# Patient Record
Sex: Female | Born: 1945 | Race: White | Hispanic: No | Marital: Single | Smoking: Never smoker
Health system: Southern US, Community
[De-identification: ages and names within clinical notes are randomized; demographics above are authoritative.]

## PROBLEM LIST (undated history)

## (undated) DIAGNOSIS — J45909 Unspecified asthma, uncomplicated: Secondary | ICD-10-CM

## (undated) DIAGNOSIS — I10 Essential (primary) hypertension: Secondary | ICD-10-CM

## (undated) HISTORY — PX: TONSILLECTOMY: SUR1361

## (undated) HISTORY — PX: ABDOMINAL HYSTERECTOMY: SHX81

## (undated) HISTORY — PX: CHOLECYSTECTOMY: SHX55

---

## 2017-01-20 ENCOUNTER — Emergency Department (HOSPITAL_BASED_OUTPATIENT_CLINIC_OR_DEPARTMENT_OTHER): Payer: Medicare Other

## 2017-01-20 ENCOUNTER — Emergency Department (HOSPITAL_BASED_OUTPATIENT_CLINIC_OR_DEPARTMENT_OTHER)
Admission: EM | Admit: 2017-01-20 | Discharge: 2017-01-20 | Disposition: A | Discharge status: Home / Self Care | Payer: Medicare Other | Attending: Emergency Medicine | Admitting: Emergency Medicine

## 2017-01-20 ENCOUNTER — Encounter (HOSPITAL_BASED_OUTPATIENT_CLINIC_OR_DEPARTMENT_OTHER): Payer: Self-pay | Admitting: *Deleted

## 2017-01-20 DIAGNOSIS — J45909 Unspecified asthma, uncomplicated: Secondary | ICD-10-CM | POA: Insufficient documentation

## 2017-01-20 DIAGNOSIS — J441 Chronic obstructive pulmonary disease with (acute) exacerbation: Secondary | ICD-10-CM | POA: Diagnosis not present

## 2017-01-20 DIAGNOSIS — Z79899 Other long term (current) drug therapy: Secondary | ICD-10-CM | POA: Diagnosis not present

## 2017-01-20 DIAGNOSIS — R0602 Shortness of breath: Secondary | ICD-10-CM | POA: Diagnosis present

## 2017-01-20 DIAGNOSIS — I10 Essential (primary) hypertension: Secondary | ICD-10-CM | POA: Diagnosis not present

## 2017-01-20 HISTORY — DX: Unspecified asthma, uncomplicated: J45.909

## 2017-01-20 HISTORY — DX: Essential (primary) hypertension: I10

## 2017-01-20 LAB — BRAIN NATRIURETIC PEPTIDE: B NATRIURETIC PEPTIDE 5: 108.8 pg/mL — AB (ref 0.0–100.0)

## 2017-01-20 LAB — TROPONIN I: Troponin I: <0.03 ng/mL (ref <0.03)

## 2017-01-20 LAB — COMPREHENSIVE METABOLIC PANEL
ALBUMIN: 3.4 g/dL — AB (ref 3.5–5.0)
ALT: 27 U/L (ref 14–54)
AST: 26 U/L (ref 15–41)
Alkaline Phosphatase: 60 U/L (ref 38–126)
Anion gap: 11 (ref 5–15)
BILIRUBIN TOTAL: 0.7 mg/dL (ref 0.3–1.2)
BUN: 21 mg/dL — AB (ref 6–20)
CHLORIDE: 102 mmol/L (ref 101–111)
CO2: 28 mmol/L (ref 22–32)
CREATININE: 1.04 mg/dL — AB (ref 0.44–1.00)
Calcium: 9 mg/dL (ref 8.9–10.3)
GFR calc Af Amer: >60 mL/min (ref >60)
GFR, EST NON AFRICAN AMERICAN: 52 mL/min — AB (ref >60)
GLUCOSE: 91 mg/dL (ref 65–99)
POTASSIUM: 3.4 mmol/L — AB (ref 3.5–5.1)
Sodium: 141 mmol/L (ref 135–145)
Total Protein: 7 g/dL (ref 6.5–8.1)

## 2017-01-20 LAB — CBC WITH DIFFERENTIAL/PLATELET
BASOS ABS: 0 10*3/uL (ref 0.0–0.1)
BASOS PCT: 0 %
Eosinophils Absolute: 0.1 10*3/uL (ref 0.0–0.7)
Eosinophils Relative: 1 %
HEMATOCRIT: 45.5 % (ref 36.0–46.0)
Hemoglobin: 15.3 g/dL — ABNORMAL HIGH (ref 12.0–15.0)
Lymphocytes Relative: 27 %
Lymphs Abs: 3.1 10*3/uL (ref 0.7–4.0)
MCH: 32.3 pg (ref 26.0–34.0)
MCHC: 33.6 g/dL (ref 30.0–36.0)
MCV: 96 fL (ref 78.0–100.0)
Monocytes Absolute: 1 10*3/uL (ref 0.1–1.0)
Monocytes Relative: 9 %
Neutro Abs: 7.4 10*3/uL (ref 1.7–7.7)
Neutrophils Relative %: 63 %
PLATELETS: 273 10*3/uL (ref 150–400)
RBC: 4.74 MIL/uL (ref 3.87–5.11)
RDW: 13.3 % (ref 11.5–15.5)
WBC: 11.6 10*3/uL — AB (ref 4.0–10.5)

## 2017-01-20 MED ORDER — METHYLPREDNISOLONE SODIUM SUCC 125 MG IJ SOLR
125.0000 mg | Freq: Once | INTRAMUSCULAR | Status: AC
  Administered 2017-01-20: 125 mg via INTRAVENOUS
  Filled 2017-01-20: qty 2

## 2017-01-20 MED ORDER — PREDNISONE 20 MG PO TABS: ORAL_TABLET | ORAL | 0 refills | Status: DC

## 2017-01-20 MED ORDER — ALBUTEROL (5 MG/ML) CONTINUOUS INHALATION SOLN
15.0000 mg/h | INHALATION_SOLUTION | Freq: Once | RESPIRATORY_TRACT | Status: AC
  Administered 2017-01-20: 15 mg/h via RESPIRATORY_TRACT
  Filled 2017-01-20: qty 20

## 2017-01-20 MED ORDER — IPRATROPIUM-ALBUTEROL 0.5-2.5 (3) MG/3ML IN SOLN
3.0000 mL | Freq: Once | RESPIRATORY_TRACT | Status: AC
  Administered 2017-01-20: 3 mL via RESPIRATORY_TRACT

## 2017-01-20 MED ORDER — IPRATROPIUM BROMIDE 0.02 % IN SOLN
1.0000 mg | Freq: Once | RESPIRATORY_TRACT | Status: AC
  Administered 2017-01-20: 1 mg via RESPIRATORY_TRACT
  Filled 2017-01-20: qty 5

## 2017-01-20 MED ORDER — IPRATROPIUM-ALBUTEROL 0.5-2.5 (3) MG/3ML IN SOLN
RESPIRATORY_TRACT | Status: AC
  Administered 2017-01-20: 3 mL via RESPIRATORY_TRACT
  Filled 2017-01-20: qty 3

## 2017-01-20 NOTE — ED Notes (Signed)
Pt ambulated while monitoring SpO2. SpO2 started at 96% and dropped to 93% at it's lowest during ambulation. HR started at 106 and raised to 122.

## 2017-01-20 NOTE — Discharge Instructions (Signed)
Take prednisone as prescribed.  Continue albuterol as needed for wheezing and cough.   See your doctor this week.   Return to ER if you have worse trouble breathing, wheezing, fever.

## 2017-01-20 NOTE — ED Provider Notes (Signed)
[facility] Note   CSN: [id number] Arrival date & time: 01/20/17  1258  By signing my name below, I, Clovis PuAvnee Patel, attest that this documentation has been prepared under the direction and in the presence of Charlynne Panderavid Hsienta Yao, MD  Electronically Signed: Clovis PuAvnee Patel, ED Scribe. 01/20/17. 5:00 PM.  History   Chief Complaint Chief Complaint  Patient presents with  . Shortness of Breath   The history is provided by the patient. No language interpreter was used.   HPI Comments:  Marissa Lamb is a 71 y.o. female, with a hx of asthma and HTN, who presents to the Emergency Department complaining of persistent, intermittent SOB onset 10 days. She also reports increased leg swelling and a cough. Pt was seen at Urgent Care 5 days ago, was negative for flu, given an solumedrol injection, prescribed 60 mg prednisone and a Z-pack which has provided no relief. She also visited Dr. Ernestina PennaWiggins today for follow up. She has been using her inhaler which provides temporary relief. Pt denies fever, hx of heart problems, any recent sick contacts diagnosed with the flu and any other associated symptoms. Pt notes her last ED visit for COPD was in 09/2016, no recent admission for COPD.   Past Medical History:  Diagnosis Date  . Asthma   . Hypertension     There are no active problems to display for this patient.   History reviewed. No pertinent surgical history.  OB History    No data available       Home Medications    Prior to Admission medications   Medication Sig Start Date End Date Taking? Authorizing Provider  albuterol (PROVENTIL HFA;VENTOLIN HFA) 108 (90 Base) MCG/ACT inhaler Inhale into the lungs every 6 (six) hours as needed for wheezing or shortness of breath.   Yes Historical Provider, MD  azithromycin (ZITHROMAX) 500 MG tablet Take by mouth daily.   Yes Historical Provider, MD  benzonatate (TESSALON) 100 MG capsule Take by mouth 3 (three) times daily as needed for  cough.   Yes Historical Provider, MD  esomeprazole (NEXIUM) 40 MG capsule Take 40 mg by mouth daily at 12 noon.   Yes Historical Provider, MD  etodolac (LODINE) 400 MG tablet Take 400 mg by mouth 2 (two) times daily.   Yes Historical Provider, MD  fluticasone (FLOVENT DISKUS) 50 MCG/BLIST diskus inhaler Inhale 1 puff into the lungs 2 (two) times daily.   Yes Historical Provider, MD  gabapentin (NEURONTIN) 300 MG capsule Take 300 mg by mouth 3 (three) times daily.   Yes Historical Provider, MD  ipratropium-albuterol (DUONEB) 0.5-2.5 (3) MG/3ML SOLN Take 3 mLs by nebulization.   Yes Historical Provider, MD  metoprolol succinate (TOPROL-XL) 100 MG 24 hr tablet Take 100 mg by mouth daily. Take with or immediately following a meal.   Yes Historical Provider, MD  predniSONE (DELTASONE) 20 MG tablet Take 60 mg by mouth daily with breakfast.   Yes Historical Provider, MD    Family History History reviewed. No pertinent family history.  Social History Social History  Substance Use Topics  . Smoking status: Never Smoker  . Smokeless tobacco: Never Used  . Alcohol use Not on file     Allergies   Patient has no known allergies.   Review of Systems Review of Systems  Respiratory: Positive for cough, shortness of breath and wheezing.   Cardiovascular: Positive for leg swelling.  All other systems reviewed and are negative.   Physical Exam Updated Vital Signs BP  159/88   Pulse 82   Temp 98.5 F (36.9 C) (Oral)   Resp 20   SpO2 97%   Physical Exam  Constitutional: She is oriented to person, place, and time. She appears well-developed and well-nourished. No distress.  HENT:  Head: Normocephalic and atraumatic.  Eyes: EOM are normal.  Neck: Normal range of motion.  Cardiovascular: Normal rate, regular rhythm and normal heart sounds.   Pulmonary/Chest: Effort normal. She has wheezes.  Diffuse wheezing, more on the left side.   Abdominal: Soft. She exhibits no distension. There is no  tenderness.  Musculoskeletal: Normal range of motion. She exhibits edema.  1+ edema bilaterally  Neurological: She is alert and oriented to person, place, and time.  Skin: Skin is warm and dry.  Psychiatric: She has a normal mood and affect. Judgment normal.  Nursing note and vitals reviewed.    ED Treatments / Results  DIAGNOSTIC STUDIES:  Oxygen Saturation is 100% on RA, normal by my interpretation.    COORDINATION OF CARE:  5:00 PM Discussed treatment plan with pt at bedside and pt agreed to plan.  Labs (all labs ordered are listed, but only abnormal results are displayed) Labs Reviewed  CBC WITH DIFFERENTIAL/PLATELET - Abnormal; Notable for the following:       Result Value   WBC 11.6 (*)    Hemoglobin 15.3 (*)    All other components within normal limits  COMPREHENSIVE METABOLIC PANEL - Abnormal; Notable for the following:    Potassium 3.4 (*)    BUN 21 (*)    Creatinine, Ser 1.04 (*)    Albumin 3.4 (*)    GFR calc non Af Amer 52 (*)    All other components within normal limits  BRAIN NATRIURETIC PEPTIDE - Abnormal; Notable for the following:    B Natriuretic Peptide 108.8 (*)    All other components within normal limits  TROPONIN I    EKG  EKG Interpretation  Date/Time:  Monday January 20 2017 13:12:57 EST Ventricular Rate:  74 PR Interval:  148 QRS Duration: 88 QT Interval:  382 QTC Calculation: 424 R Axis:   52 Text Interpretation:  Sinus rhythm with Premature supraventricular complexes Otherwise normal ECG No previous ECGs available Confirmed by YAO  MD, DAVID ([id number]) on 01/20/2017 4:24:32 PM       Radiology Dg Chest 2 View  Result Date: 01/20/2017 CLINICAL DATA:  Ten day history of cough, shortness of breath and chest congestion. EXAM: CHEST  2 VIEW COMPARISON:  10/14/2016, 07/18/2014, including CTA chest 10/14/2016. FINDINGS: Cardiac silhouette mildly enlarged, unchanged. Hilar and mediastinal contours otherwise unremarkable. Lungs clear.  Bronchovascular markings normal. Pulmonary vascularity normal. No visible pleural effusions. No pneumothorax. Degenerative changes throughout the thoracic spine. IMPRESSION: Stable mild cardiomegaly.  No acute cardiopulmonary disease. Electronically Signed   By: Hulan Saas M.D.   On: 01/20/2017 16:45    Procedures Procedures (including critical care time)  Medications Ordered in ED Medications  ipratropium-albuterol (DUONEB) 0.5-2.5 (3) MG/3ML nebulizer solution 3 mL (3 mLs Nebulization Given 01/20/17 1316)  albuterol (PROVENTIL,VENTOLIN) solution continuous neb (15 mg/hr Nebulization Given 01/20/17 1715)  ipratropium (ATROVENT) nebulizer solution 1 mg (1 mg Nebulization Given 01/20/17 1715)  methylPREDNISolone sodium succinate (SOLU-MEDROL) 125 mg/2 mL injection 125 mg (125 mg Intravenous Given 01/20/17 1720)     Initial Impression / Assessment and Plan / ED Course  I have reviewed the triage vital signs and the nursing notes.  Pertinent labs & imaging results that were available during my  care of the patient were reviewed by me and considered in my medical decision making (see chart for details).    Marissa Lamb is a 71 y.o. female here with cough, SOB. Wheezing throughout, slightly tachypneic. Mild pedal edema. Consider bronchitis vs COPD vs flu vs new onset CHF. I doubt ACS and symptoms for several days so trop x 1 sufficient. Will get labs, BNP, CXR. Will give continuous neb, solumedrol and reassess.   7:25 PM Felt better after continuous neb, solumedrol. Ambulated and O2 was 92% on RA. Improved air movement now. Minimal wheezing now. CXR showed no pneumonia. Labs unremarkable. Will dc home with steroid taper. Has albuterol at home.    Final Clinical Impressions(s) / ED Diagnoses   Final diagnoses:  None    New Prescriptions New Prescriptions   No medications on file  I personally performed the services described in this documentation, which was scribed in my presence. The  recorded information has been reviewed and is accurate.    Charlynne Pander, MD 01/20/17 [phone]

## 2017-01-20 NOTE — ED Notes (Signed)
Patient transported to X-ray 

## 2017-01-20 NOTE — ED Triage Notes (Signed)
Pt reports 10 days of chest congestion and cough, seen at urgent care with neg flu test, given solumedrol inj and prednisone rx, pt states that the prednisone isn't helping as much as it usually does, and she still feels bad.

## 2017-05-18 ENCOUNTER — Inpatient Hospital Stay (HOSPITAL_BASED_OUTPATIENT_CLINIC_OR_DEPARTMENT_OTHER)
Admission: EM | Admit: 2017-05-18 | Discharge: 2017-05-21 | DRG: 603 | Disposition: A | Discharge status: Home / Self Care | Payer: Medicare Other | Attending: Internal Medicine | Admitting: Internal Medicine

## 2017-05-18 ENCOUNTER — Emergency Department (HOSPITAL_BASED_OUTPATIENT_CLINIC_OR_DEPARTMENT_OTHER): Payer: Medicare Other

## 2017-05-18 ENCOUNTER — Encounter (HOSPITAL_BASED_OUTPATIENT_CLINIC_OR_DEPARTMENT_OTHER): Payer: Self-pay

## 2017-05-18 DIAGNOSIS — Z9049 Acquired absence of other specified parts of digestive tract: Secondary | ICD-10-CM | POA: Diagnosis not present

## 2017-05-18 DIAGNOSIS — L0390 Cellulitis, unspecified: Secondary | ICD-10-CM | POA: Diagnosis present

## 2017-05-18 DIAGNOSIS — Z90710 Acquired absence of both cervix and uterus: Secondary | ICD-10-CM | POA: Diagnosis not present

## 2017-05-18 DIAGNOSIS — M79605 Pain in left leg: Secondary | ICD-10-CM

## 2017-05-18 DIAGNOSIS — F39 Unspecified mood [affective] disorder: Secondary | ICD-10-CM | POA: Diagnosis present

## 2017-05-18 DIAGNOSIS — J45909 Unspecified asthma, uncomplicated: Secondary | ICD-10-CM | POA: Diagnosis present

## 2017-05-18 DIAGNOSIS — Z6841 Body Mass Index (BMI) 40.0 and over, adult: Secondary | ICD-10-CM

## 2017-05-18 DIAGNOSIS — I10 Essential (primary) hypertension: Secondary | ICD-10-CM | POA: Diagnosis present

## 2017-05-18 DIAGNOSIS — G4733 Obstructive sleep apnea (adult) (pediatric): Secondary | ICD-10-CM | POA: Diagnosis present

## 2017-05-18 DIAGNOSIS — Z9181 History of falling: Secondary | ICD-10-CM | POA: Diagnosis not present

## 2017-05-18 DIAGNOSIS — L03116 Cellulitis of left lower limb: Secondary | ICD-10-CM | POA: Diagnosis not present

## 2017-05-18 DIAGNOSIS — Z79899 Other long term (current) drug therapy: Secondary | ICD-10-CM

## 2017-05-18 DIAGNOSIS — Z9119 Patient's noncompliance with other medical treatment and regimen: Secondary | ICD-10-CM

## 2017-05-18 DIAGNOSIS — Z7952 Long term (current) use of systemic steroids: Secondary | ICD-10-CM | POA: Diagnosis not present

## 2017-05-18 DIAGNOSIS — S81812A Laceration without foreign body, left lower leg, initial encounter: Secondary | ICD-10-CM | POA: Diagnosis present

## 2017-05-18 DIAGNOSIS — L539 Erythematous condition, unspecified: Secondary | ICD-10-CM

## 2017-05-18 HISTORY — DX: Morbid (severe) obesity due to excess calories: E66.01

## 2017-05-18 LAB — COMPREHENSIVE METABOLIC PANEL
ALT: 31 U/L (ref 14–54)
ANION GAP: 8 (ref 5–15)
AST: 29 U/L (ref 15–41)
Albumin: 3.4 g/dL — ABNORMAL LOW (ref 3.5–5.0)
Alkaline Phosphatase: 76 U/L (ref 38–126)
BUN: 18 mg/dL (ref 6–20)
CO2: 29 mmol/L (ref 22–32)
Calcium: 8.6 mg/dL — ABNORMAL LOW (ref 8.9–10.3)
Chloride: 101 mmol/L (ref 101–111)
Creatinine, Ser: 0.74 mg/dL (ref 0.44–1.00)
GFR calc Af Amer: >60 mL/min (ref >60)
Glucose, Bld: 123 mg/dL — ABNORMAL HIGH (ref 65–99)
POTASSIUM: 3.8 mmol/L (ref 3.5–5.1)
Sodium: 138 mmol/L (ref 135–145)
TOTAL PROTEIN: 6.8 g/dL (ref 6.5–8.1)
Total Bilirubin: 0.6 mg/dL (ref 0.3–1.2)

## 2017-05-18 LAB — CBC WITH DIFFERENTIAL/PLATELET
Basophils Absolute: 0 10*3/uL (ref 0.0–0.1)
Basophils Relative: 0 %
Eosinophils Absolute: 0.3 10*3/uL (ref 0.0–0.7)
Eosinophils Relative: 3 %
HEMATOCRIT: 42.2 % (ref 36.0–46.0)
Hemoglobin: 13.9 g/dL (ref 12.0–15.0)
LYMPHS PCT: 25 %
Lymphs Abs: 2.5 10*3/uL (ref 0.7–4.0)
MCH: 32.4 pg (ref 26.0–34.0)
MCHC: 32.9 g/dL (ref 30.0–36.0)
MCV: 98.4 fL (ref 78.0–100.0)
MONO ABS: 0.9 10*3/uL (ref 0.1–1.0)
MONOS PCT: 9 %
NEUTROS ABS: 6.3 10*3/uL (ref 1.7–7.7)
Neutrophils Relative %: 63 %
Platelets: 252 10*3/uL (ref 150–400)
RBC: 4.29 MIL/uL (ref 3.87–5.11)
RDW: 13 % (ref 11.5–15.5)
WBC: 10 10*3/uL (ref 4.0–10.5)

## 2017-05-18 LAB — I-STAT CG4 LACTIC ACID, ED: LACTIC ACID, VENOUS: 1.16 mmol/L (ref 0.5–1.9)

## 2017-05-18 MED ORDER — CLINDAMYCIN PHOSPHATE 600 MG/50ML IV SOLN
600.0000 mg | Freq: Once | INTRAVENOUS | Status: AC
  Administered 2017-05-18: 600 mg via INTRAVENOUS
  Filled 2017-05-18: qty 50

## 2017-05-18 MED ORDER — VANCOMYCIN HCL 10 G IV SOLR
1750.0000 mg | INTRAVENOUS | Status: DC
  Filled 2017-05-18: qty 1750

## 2017-05-18 MED ORDER — VANCOMYCIN HCL 10 G IV SOLR
2000.0000 mg | Freq: Once | INTRAVENOUS | Status: DC
  Filled 2017-05-18: qty 2000

## 2017-05-18 MED ORDER — IOPAMIDOL (ISOVUE-300) INJECTION 61%
100.0000 mL | Freq: Once | INTRAVENOUS | Status: AC | PRN
  Administered 2017-05-19: 100 mL via INTRAVENOUS

## 2017-05-18 MED ORDER — VANCOMYCIN HCL 10 G IV SOLR
2500.0000 mg | Freq: Once | INTRAVENOUS | Status: DC
  Filled 2017-05-18: qty 2500

## 2017-05-18 NOTE — ED Provider Notes (Signed)
[facility]   CSN: [id number] Arrival date & time: 05/18/17  1449 By signing my name below, I, Marissa Lamb, attest that this documentation has been prepared under the direction and in the presence of Marissa Lamb, Marissa Brimhristopher J, MD . Electronically Signed: Levon HedgerElizabeth Lamb, Scribe. 05/18/2017. 7:28 PM.   History   Chief Complaint Chief Complaint  Patient presents with  . Wound Check    HPI Marissa Lamb is a 71 y.o. female with a history of asthma and HTN who presents to the Emergency Department complaining of progressively worsening erythema and swelling to LLE laceration. The laceration was sutured and she was started on 7 day course of Keflex 500 mg which she has taken with no relief. Two days ago, pt noticed a small pustule to the wound. Pt reports associated blistering and intermittent pain to her LLE. Pain is exacerbated by ambulation. No hx of prior skin infections or DM. She denies any fever, chills, nausea, vomiting, left knee or hip pain, or numbness.  The history is provided by the patient and a relative. No language interpreter was used.  Rash   This is a new problem. The current episode started more than 2 days ago. The problem has been rapidly worsening. Associated with: laceration. There has been no fever. The rash is present on the left lower leg. The pain is moderate. The pain has been constant since onset. Associated symptoms include blisters and pain. Treatments tried: antibiotics. The treatment provided no relief.   Past Medical History:  Diagnosis Date  . Asthma   . Hypertension   . Morbid obesity (HCC)     There are no active problems to display for this patient.   Past Surgical History:  Procedure Laterality Date  . ABDOMINAL HYSTERECTOMY    . CHOLECYSTECTOMY    . TONSILLECTOMY      OB History    No data available     Home Medications    Prior to Admission medications   Medication Sig Start Date End Date Taking? Authorizing  Provider  albuterol (PROVENTIL HFA;VENTOLIN HFA) 108 (90 Base) MCG/ACT inhaler Inhale into the lungs every 6 (six) hours as needed for wheezing or shortness of breath.   Yes [provider]  esomeprazole (NEXIUM) 40 MG capsule Take 40 mg by mouth daily at 12 noon.   Yes [provider]  etodolac (LODINE) 400 MG tablet Take 400 mg by mouth 2 (two) times daily.   Yes [provider]  fluticasone (FLONASE) 50 MCG/ACT nasal spray Place into both nostrils daily.   Yes [provider]  fluticasone (FLOVENT DISKUS) 50 MCG/BLIST diskus inhaler Inhale 1 puff into the lungs 2 (two) times daily.   Yes [provider]  gabapentin (NEURONTIN) 300 MG capsule Take 300 mg by mouth 3 (three) times daily.   Yes [provider]  ipratropium-albuterol (DUONEB) 0.5-2.5 (3) MG/3ML SOLN Take 3 mLs by nebulization.   Yes [provider]  metoprolol succinate (TOPROL-XL) 100 MG 24 hr tablet Take 100 mg by mouth daily. Take with or immediately following a meal.   Yes [provider]  azithromycin (ZITHROMAX) 500 MG tablet Take by mouth daily.    [provider]  benzonatate (TESSALON) 100 MG capsule Take by mouth 3 (three) times daily as needed for cough.    [provider]  predniSONE (DELTASONE) 20 MG tablet Take 60 mg daily x 3 days then 40 mg daily x 3 days then 20 mg daily x 3  days 01/20/17   Charlynne Pander, MD    Family History History reviewed. No pertinent family history.  Social History Social History  Substance Use Topics  . Smoking status: Never Smoker  . Smokeless tobacco: Never Used  . Alcohol use No     Allergies   Patient has no known allergies.   Review of Systems Review of Systems  Constitutional: Negative for appetite change, chills, diaphoresis, fatigue and fever.  HENT: Negative for congestion and rhinorrhea.   Eyes: Negative for visual disturbance.  Respiratory: Negative for apnea, chest  tightness, shortness of breath, wheezing and stridor.   Cardiovascular: Negative for chest pain.  Gastrointestinal: Negative for abdominal pain, constipation, diarrhea, nausea and vomiting.  Genitourinary: Negative for dysuria, flank pain and frequency.  Musculoskeletal: Positive for myalgias. Negative for arthralgias, back pain, neck pain and neck stiffness.  Skin: Positive for color change, rash and wound.  Neurological: Negative for light-headedness, numbness and headaches.  Psychiatric/Behavioral: Negative for agitation and confusion.  All other systems reviewed and are negative.  Physical Exam Updated Vital Signs BP (!) 144/75 (BP Location: Left Arm)   Pulse 79   Temp 99.2 F (37.3 C) (Oral)   Resp (!) 22   Ht 5' 2.5" (1.588 m)   Wt (!) 330 lb (149.7 kg)   SpO2 97%   BMI 59.40 kg/m   Physical Exam  Constitutional: She is oriented to person, place, and time. She appears well-developed and well-nourished. No distress.  HENT:  Head: Normocephalic and atraumatic.  Mouth/Throat: Oropharynx is clear and moist. No oropharyngeal exudate.  Eyes: EOM are normal. Pupils are equal, round, and reactive to light.  Neck: Normal range of motion.  Cardiovascular: Normal rate, regular rhythm, normal heart sounds, intact distal pulses and normal pulses.   Pulmonary/Chest: Effort normal and breath sounds normal. She has no wheezes. She exhibits no tenderness.  Abdominal: Soft. She exhibits no distension. There is no tenderness.  Musculoskeletal: Normal range of motion. She exhibits edema and tenderness.       Left lower leg: She exhibits tenderness, swelling, edema and laceration.  Neurological: She is alert and oriented to person, place, and time. No sensory deficit. She exhibits normal muscle tone.  Skin: Skin is warm and dry. Capillary refill takes less than 2 seconds. Rash noted. She is not diaphoretic. There is erythema.  Left Lower extremity: Large area of induration and tenderness with  vesicles surrounding the laceration repair site. Good pulses. Normal sensation, normal strength. No crepitance. Photo attached below.   Psychiatric: She has a normal mood and affect. Judgment normal.  Nursing note and vitals reviewed.    ED Treatments / Results  DIAGNOSTIC STUDIES:  Oxygen Saturation is 97% on RA, normal by my interpretation.    COORDINATION OF CARE:  7:27 PM Discussed treatment plan with pt at bedside and pt agreed to plan.   Labs (all labs ordered are listed, but only abnormal results are displayed) Labs Reviewed  COMPREHENSIVE METABOLIC PANEL - Abnormal; Notable for the following:       Result Value   Glucose, Bld 123 (*)    Calcium 8.6 (*)    Albumin 3.4 (*)    All other components within normal limits  CULTURE, BLOOD (ROUTINE X 2)  CULTURE, BLOOD (ROUTINE X 2)  CBC WITH DIFFERENTIAL/PLATELET  CREATININE, SERUM  I-STAT CG4 LACTIC ACID, ED  I-STAT CG4 LACTIC ACID, ED    EKG  EKG Interpretation None       Radiology Dg Tibia/fibula  Left  Result Date: 05/18/2017 CLINICAL DATA:  Progressive pain and swelling. Laceration, sutured 1 week prior. EXAM: LEFT TIBIA AND FIBULA - 2 VIEW COMPARISON:  None. FINDINGS: No fracture. 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 No periosteal reaction or bony destructive change. Soft tissue edema is most prominent anterior laterally. Probable skin defect about the mid distal leg. No radiopaque foreign body or tracking soft tissue air. IMPRESSION: Soft tissue edema is most prominent anterior laterally, possible skin defect about the mid distal lateral lower leg. No acute osseous abnormalities. Electronically Signed   By: Rubye Oaks M.D.   On: 05/18/2017 23:46    Procedures Procedures (including critical care time)  EMERGENCY DEPARTMENT  US GUIDANCE EXAM Emergency Ultrasound:  US Guidance for Needle Guidance  INDICATIONS: Difficult vascular access Linear probe  used in real-time to visualize location of needle entry through skin.   PERFORMED BY: Myself IMAGES ARCHIVED?: Yes LIMITATIONS: obesity VIEWS USED: Transverse INTERPRETATION: Needle visualized within vein, Left arm and Needle gauge 20    Medications Ordered in ED Medications  iopamidol (ISOVUE-300) 61 % injection 100 mL (not administered)  clindamycin (CLEOCIN) IVPB 600 mg (0 mg Intravenous Stopped 05/18/17 2129)     Initial Impression / Assessment and Plan / ED Course  I have reviewed the triage vital signs and the nursing notes.  Pertinent labs & imaging results that were available during my care of the patient were reviewed by me and considered in my medical decision making (see chart for details).     Elvia Aydin is a 71 y.o. female with a past medical history significant for asthma, hypertension, and recent left laceration 1 week ago currently finishing a Keflex course who presents with rapidly worsening left lower extremity infection. Patient has come in by family who reports that one week ago, patient stepped into a metal grate that sliced her left shin. Patient showed a picture of a deep left shin wound that was repaired at an outside facility. Patient was started on Keflex after repair and is on the last day of that course. She says that starting yesterday, a vesicle arose on the proximal aspect of the wound however today, the wound has grown more erythematous, darkened, tender, swollen, and more vesicles are seen. Patient reports moderate to severe pain in the location. She denies numbness or tingling distal. Patient has no systemic signs of infection such as fevers, chills, nausea, vomiting.  History and exam are seen above. On exam, patient has a large area of induration, erythema, and apparent infection. Patient's wound does not appear to have dehissed, but there are areas of darkened skin concerning for necrosis. See clinical photo above. No crepitance. Physical exam otherwise  unremarkable.  Ultrasound IV placed by me after initial IV infiltrated.  Based on appearance of wound, concern for failure of outpatient antibiotics for wound infection. Patient reports that she received a tetanus vaccination during her visit. Initially, CT scan was considered to rule out deep abscess from the wound however, due to patient's weight and body habitus, she was not a candidate for leg CT in the CT scanner this facility. Thus, x-ray was obtained which did not show evidence of subcutaneous air. Doubt necrotizing fasciitis based on exam, appearance, and x-ray results. No evidence of bony involvement in the infection on x-ray.  Patient started on IV clindamycin. Patient's laboratory testing was reassuring with a normal lactic acid, no leukocytosis, and grossly unremarkable CMP. Blood cultures were obtained.  Due to failure of outpatient antibiotics and rapidly worsening infection, patient will be admitted for IV antibiotics and observation. Patient may need debridement and surgical evaluation if her wound does not improve. Patient will be admitted to [facility] for further management.   1:30 AM While awaiting for transport, CT scan was able to be performed in this emergency department.  CT read shows concern for large hematoma and adjacent cellulitis. There was comment of subcutaneous gas however, Dr. Sterling Big with radiology was called and he confirmed that he is not suspicious of necrotizing fasciitis based on this. He suspects the subcutaneous air is from the large laceration that was repaired. There continues to be no evidence of underlying bone fracture or bony destruction. Patient will continue to admission plan as previously arranged.    Final Clinical Impressions(s) / ED Diagnoses   Final diagnoses:  Erythema  Left leg pain  Cellulitis of left lower extremity    New Prescriptions New Prescriptions   No medications on file   I personally performed the services described in  this documentation, which was scribed in my presence. The recorded information has been reviewed and is accurate.   Clinical Impression: 1. Cellulitis of left lower extremity   2. Erythema   3. Left leg pain     Disposition: Admit to Hospitalist Service    Desa Rech, Marissa Brim, MD 05/19/17 [phone]

## 2017-05-18 NOTE — ED Triage Notes (Addendum)
Pt reports left lower extremity laceration on [facility], states area sutured but now is more red and swollen, with purulent drainage. Pt is on Keflex 500mg  TID, due to complete in a day. Not taking prescribed pain medications because they are ineffective.

## 2017-05-18 NOTE — ED Notes (Signed)
Family at bedside. 

## 2017-05-18 NOTE — ED Notes (Signed)
No changes. Alert, NAD, calm, interactive, resps e/u, speaking in clear complete sentences, no dyspnea noted, skin W&D, VSS, (denies: pain, sob, nausea, dizziness or visual changes). Attempted again for US guided IV access x2 (unsuccessful), Dr. Rush Landmarkegeler aware.

## 2017-05-18 NOTE — ED Notes (Signed)
Patient is resting comfortably. 

## 2017-05-18 NOTE — Progress Notes (Signed)
Pharmacy Antibiotic Note  Marissa Lamb is a 71 y.o. female admitted on 05/18/2017 with cellulitis.  Pharmacy has been consulted for vancomycin dosing. Patient with L lower extremity laceration and has been on a 7 day course of cephalexin prior to admission. Patient is morbidly obese and has a SCr of 1.04 back in February 2018 (estimated CrCl ~60 ml/min), new labs pending.  Plan: - Vancomycin 2500 mg IV loading dose x1 - Vancomycin 1750 mg IV q24h - Monitor renal function and VT as indicated - Monitor C&S, CBC and duration of therapy  Height: 5' 2.5" (158.8 cm) Weight: (!) 330 lb (149.7 kg) IBW/kg (Calculated) : 51.25  Temp (24hrs), Avg:98.8 F (37.1 C), Min:98.4 F (36.9 C), Max:99.2 F (37.3 C)  No results for input(s): WBC, CREATININE, LATICACIDVEN, VANCOTROUGH, VANCOPEAK, VANCORANDOM, GENTTROUGH, GENTPEAK, GENTRANDOM, TOBRATROUGH, TOBRAPEAK, TOBRARND, AMIKACINPEAK, AMIKACINTROU, AMIKACIN in the last 168 hours.  CrCl cannot be calculated (Patient's most recent lab result is older than the maximum 21 days allowed.).    No Known Allergies  Antimicrobials this admission: Cephalexin 5/29 >> 6/3 Vancomycin 6/3 >>   Dose adjustments this admission:  Microbiology results: 6/3 BCx:   Thank you for allowing pharmacy to be a part of this patient's care.  Casilda Carlsaylor Jaspreet Bodner, PharmD, BCPS PGY-2 Infectious Diseases Pharmacy Resident Pager: [phone] 05/18/2017 7:49 PM

## 2017-05-18 NOTE — ED Notes (Signed)
Patient transported to X-ray 

## 2017-05-19 ENCOUNTER — Encounter (HOSPITAL_COMMUNITY): Payer: Self-pay | Admitting: Internal Medicine

## 2017-05-19 ENCOUNTER — Inpatient Hospital Stay (HOSPITAL_BASED_OUTPATIENT_CLINIC_OR_DEPARTMENT_OTHER): Payer: Medicare Other

## 2017-05-19 DIAGNOSIS — J45909 Unspecified asthma, uncomplicated: Secondary | ICD-10-CM | POA: Diagnosis not present

## 2017-05-19 DIAGNOSIS — Z9119 Patient's noncompliance with other medical treatment and regimen: Secondary | ICD-10-CM | POA: Diagnosis not present

## 2017-05-19 DIAGNOSIS — F39 Unspecified mood [affective] disorder: Secondary | ICD-10-CM | POA: Diagnosis not present

## 2017-05-19 DIAGNOSIS — L539 Erythematous condition, unspecified: Secondary | ICD-10-CM

## 2017-05-19 DIAGNOSIS — Z6841 Body Mass Index (BMI) 40.0 and over, adult: Secondary | ICD-10-CM | POA: Diagnosis not present

## 2017-05-19 DIAGNOSIS — L03116 Cellulitis of left lower limb: Secondary | ICD-10-CM | POA: Diagnosis present

## 2017-05-19 DIAGNOSIS — Z7952 Long term (current) use of systemic steroids: Secondary | ICD-10-CM | POA: Diagnosis not present

## 2017-05-19 DIAGNOSIS — Z90710 Acquired absence of both cervix and uterus: Secondary | ICD-10-CM | POA: Diagnosis not present

## 2017-05-19 DIAGNOSIS — L0390 Cellulitis, unspecified: Secondary | ICD-10-CM | POA: Diagnosis present

## 2017-05-19 DIAGNOSIS — G4733 Obstructive sleep apnea (adult) (pediatric): Secondary | ICD-10-CM | POA: Diagnosis not present

## 2017-05-19 DIAGNOSIS — I10 Essential (primary) hypertension: Secondary | ICD-10-CM | POA: Diagnosis not present

## 2017-05-19 DIAGNOSIS — Z9181 History of falling: Secondary | ICD-10-CM | POA: Diagnosis not present

## 2017-05-19 DIAGNOSIS — M79605 Pain in left leg: Secondary | ICD-10-CM

## 2017-05-19 DIAGNOSIS — Z79899 Other long term (current) drug therapy: Secondary | ICD-10-CM | POA: Diagnosis not present

## 2017-05-19 DIAGNOSIS — S81812A Laceration without foreign body, left lower leg, initial encounter: Secondary | ICD-10-CM | POA: Diagnosis not present

## 2017-05-19 DIAGNOSIS — Z9049 Acquired absence of other specified parts of digestive tract: Secondary | ICD-10-CM | POA: Diagnosis not present

## 2017-05-19 LAB — BASIC METABOLIC PANEL
Anion gap: 8 (ref 5–15)
BUN: 19 mg/dL (ref 6–20)
CALCIUM: 8.6 mg/dL — AB (ref 8.9–10.3)
CHLORIDE: 103 mmol/L (ref 101–111)
CO2: 29 mmol/L (ref 22–32)
CREATININE: 0.72 mg/dL (ref 0.44–1.00)
GFR calc non Af Amer: >60 mL/min (ref >60)
Glucose, Bld: 116 mg/dL — ABNORMAL HIGH (ref 65–99)
Potassium: 4.3 mmol/L (ref 3.5–5.1)
Sodium: 140 mmol/L (ref 135–145)

## 2017-05-19 LAB — CBC
HCT: 42 % (ref 36.0–46.0)
Hemoglobin: 14 g/dL (ref 12.0–15.0)
MCH: 32.5 pg (ref 26.0–34.0)
MCHC: 33.3 g/dL (ref 30.0–36.0)
MCV: 97.4 fL (ref 78.0–100.0)
Platelets: 266 10*3/uL (ref 150–400)
RBC: 4.31 MIL/uL (ref 3.87–5.11)
RDW: 13.1 % (ref 11.5–15.5)
WBC: 11.1 10*3/uL — ABNORMAL HIGH (ref 4.0–10.5)

## 2017-05-19 MED ORDER — METOPROLOL SUCCINATE ER 50 MG PO TB24
100.0000 mg | ORAL_TABLET | Freq: Every day | ORAL | Status: DC
  Administered 2017-05-19 – 2017-05-21 (×3): 100 mg via ORAL
  Filled 2017-05-19 (×3): qty 2

## 2017-05-19 MED ORDER — ACETAMINOPHEN 325 MG PO TABS
650.0000 mg | ORAL_TABLET | Freq: Four times a day (QID) | ORAL | Status: DC | PRN
  Administered 2017-05-19 (×2): 650 mg via ORAL
  Filled 2017-05-19 (×3): qty 2

## 2017-05-19 MED ORDER — VANCOMYCIN HCL 10 G IV SOLR
1750.0000 mg | INTRAVENOUS | Status: DC
  Administered 2017-05-20: 05:00:00 1750 mg via INTRAVENOUS
  Filled 2017-05-19: qty 750

## 2017-05-19 MED ORDER — ACETAMINOPHEN 650 MG RE SUPP
650.0000 mg | Freq: Four times a day (QID) | RECTAL | Status: DC | PRN
Start: 2017-05-19 — End: 2017-05-21

## 2017-05-19 MED ORDER — ONDANSETRON HCL 4 MG PO TABS: 4.0000 mg | ORAL_TABLET | Freq: Four times a day (QID) | ORAL | Status: DC | PRN

## 2017-05-19 MED ORDER — DULOXETINE HCL 30 MG PO CPEP
30.0000 mg | ORAL_CAPSULE | Freq: Every day | ORAL | Status: DC
  Administered 2017-05-19 – 2017-05-20 (×2): 30 mg via ORAL
  Filled 2017-05-19 (×2): qty 1

## 2017-05-19 MED ORDER — PIPERACILLIN-TAZOBACTAM 3.375 G IVPB
3.3750 g | Freq: Three times a day (TID) | INTRAVENOUS | Status: DC
  Administered 2017-05-19 – 2017-05-20 (×4): 3.375 g via INTRAVENOUS
  Filled 2017-05-19 (×5): qty 50

## 2017-05-19 MED ORDER — ONDANSETRON HCL 4 MG/2ML IJ SOLN: 4.0000 mg | Freq: Four times a day (QID) | INTRAMUSCULAR | Status: DC | PRN

## 2017-05-19 MED ORDER — AMITRIPTYLINE HCL 10 MG PO TABS
10.0000 mg | ORAL_TABLET | Freq: Every day | ORAL | Status: DC
  Administered 2017-05-19 – 2017-05-20 (×2): 10 mg via ORAL
  Filled 2017-05-19 (×3): qty 1

## 2017-05-19 MED ORDER — VANCOMYCIN HCL IN DEXTROSE 1-5 GM/200ML-% IV SOLN: 1000.0000 mg | Freq: Once | INTRAVENOUS | Status: DC

## 2017-05-19 MED ORDER — MORPHINE SULFATE (PF) 2 MG/ML IV SOLN: 2.0000 mg | INTRAVENOUS | Status: DC | PRN

## 2017-05-19 MED ORDER — ALBUTEROL SULFATE (2.5 MG/3ML) 0.083% IN NEBU: 3.0000 mL | INHALATION_SOLUTION | Freq: Four times a day (QID) | RESPIRATORY_TRACT | Status: DC | PRN

## 2017-05-19 MED ORDER — GABAPENTIN 300 MG PO CAPS
300.0000 mg | ORAL_CAPSULE | Freq: Two times a day (BID) | ORAL | Status: DC
  Administered 2017-05-19 – 2017-05-21 (×5): 300 mg via ORAL
  Filled 2017-05-19 (×5): qty 1

## 2017-05-19 MED ORDER — PANTOPRAZOLE SODIUM 40 MG PO TBEC
40.0000 mg | DELAYED_RELEASE_TABLET | Freq: Every day | ORAL | Status: DC
  Administered 2017-05-19 – 2017-05-21 (×3): 40 mg via ORAL
  Filled 2017-05-19 (×3): qty 1

## 2017-05-19 MED ORDER — SODIUM CHLORIDE 0.9 % IV SOLN
2500.0000 mg | Freq: Once | INTRAVENOUS | Status: AC
  Administered 2017-05-19: 06:00:00 2500 mg via INTRAVENOUS
  Filled 2017-05-19: qty 2000

## 2017-05-19 MED ORDER — IPRATROPIUM-ALBUTEROL 0.5-2.5 (3) MG/3ML IN SOLN
3.0000 mL | Freq: Four times a day (QID) | RESPIRATORY_TRACT | Status: DC | PRN
Start: 2017-05-19 — End: 2017-05-21

## 2017-05-19 MED ORDER — FLUTICASONE PROPIONATE 50 MCG/ACT NA SUSP
1.0000 | Freq: Every day | NASAL | Status: DC
  Filled 2017-05-19: qty 16

## 2017-05-19 NOTE — Progress Notes (Addendum)
Patient arrived to the unit via [facility] at approximately 0310. She is alert and verbally responsive. Patient is in no acute distress at this time. Small amount of drainage noted from left leg wound. No complaints voiced at this time. Admitting MD notified at approximately [id number] re: patient's arrival.

## 2017-05-19 NOTE — H&P (Signed)
History and Physical    Kimie Pidcock ZOX:[id number] DOB: July 28, 1946 DOA: 05/18/2017  PCP: Mattie Marlin, MD  Patient coming from: Home.  Chief Complaint: Left leg swelling and pain.  HPI: Marissa Lamb is a 71 y.o. female with history of morbid obesity, osteoarthritis, hypertension and asthma presents to the ER because of worsening pain and swelling in the left leg. One week ago patient had a fall at her home following which had a laceration of the left leg and was sutured at [facility]. Following the patient's leg has been gradually swelling with pain. Denies any fever or chills. Has mild discharge from the suture area.   ED Course: In the ER CT scan was done shows left leg hematoma with possible overlying cellulitis. On exam patient's skin looks discolored with some blebs. Patient has good sensation and is able to move all joints. Patient was started on antibiotics and admitted for developing cellulitis.  Review of Systems: As per HPI, rest all negative.   Past Medical History:  Diagnosis Date  . Asthma   . Hypertension   . Morbid obesity (HCC)     Past Surgical History:  Procedure Laterality Date  . ABDOMINAL HYSTERECTOMY    . CHOLECYSTECTOMY    . TONSILLECTOMY       reports that she has never smoked. She has never used smokeless tobacco. She reports that she does not drink alcohol or use drugs.  No Known Allergies  Family History  Problem Relation Age of Onset  . Diabetes Mellitus II Neg Hx     Prior to Admission medications   Medication Sig Start Date End Date Taking? Authorizing Provider  albuterol (PROVENTIL HFA;VENTOLIN HFA) 108 (90 Base) MCG/ACT inhaler Inhale 2 puffs into the lungs every 6 (six) hours as needed for wheezing or shortness of breath.    Yes [provider]  amitriptyline (ELAVIL) 10 MG tablet Take 10 mg by mouth at bedtime. 04/27/17  Yes [provider]  DULoxetine (CYMBALTA) 30 MG capsule Take 30 mg by mouth  at bedtime.  04/27/17  Yes [provider]  esomeprazole (NEXIUM) 40 MG capsule Take 40 mg by mouth daily as needed (reflux).    Yes [provider]  etodolac (LODINE) 400 MG tablet Take 400 mg by mouth 2 (two) times daily.   Yes [provider]  fluticasone (FLONASE) 50 MCG/ACT nasal spray Place 1 spray into both nostrils daily.    Yes [provider]  gabapentin (NEURONTIN) 300 MG capsule Take 300 mg by mouth 2 (two) times daily.    Yes [provider]  ipratropium-albuterol (DUONEB) 0.5-2.5 (3) MG/3ML SOLN Take 3 mLs by nebulization every 6 (six) hours as needed (SOB, wheezing).    Yes [provider]  metoprolol succinate (TOPROL-XL) 100 MG 24 hr tablet Take 100 mg by mouth daily after breakfast. Take with or immediately following a meal.    Yes [provider]  azithromycin (ZITHROMAX) 500 MG tablet Take by mouth daily.    [provider]  benzonatate (TESSALON) 100 MG capsule Take by mouth 3 (three) times daily as needed for cough.    [provider]  cephALEXin (KEFLEX) 500 MG capsule Take 500 mg by mouth 3 (three) times daily.    [provider]  predniSONE (DELTASONE) 20 MG tablet Take 60 mg daily x 3 days then 40 mg daily x 3 days then 20 mg daily x 3 days 01/20/17   Charlynne Pander, MD  Physical Exam: Vitals:   05/18/17 2330 05/18/17 2345 05/19/17 0130 05/19/17 0131  BP: 126/64 (!) 141/91 135/62 135/62  Pulse: 68 73 73 72  Resp:    20  Temp:      TempSrc:      SpO2: 97% 100% 97% 97%  Weight:      Height:          Constitutional: Obese not in distress. Vitals:   05/18/17 2330 05/18/17 2345 05/19/17 0130 05/19/17 0131  BP: 126/64 (!) 141/91 135/62 135/62  Pulse: 68 73 73 72  Resp:    20  Temp:      TempSrc:      SpO2: 97% 100% 97% 97%  Weight:      Height:       Eyes: Anicteric no pallor. ENMT: No discharge from the ears eyes nose and mouth. Neck: No mass. No neck  rigidity. Respiratory: No rhonchi or crepitations. Cardiovascular: S1 and S2 heard no murmurs appreciated. Abdomen: Soft nontender bowel sounds present. Musculoskeletal: Left leg swelling with blebs and discolored skin around the left anterolateral area of the left lower extremity. Skin: Blebs and skin discoloration the left anterolateral area of the leg. Neurologic: Alert awake oriented to time place and person. Moves all extremities. Psychiatric: Appears normal. Normal affect.   Labs on Admission: I have personally reviewed following labs and imaging studies  CBC:  Recent Labs Lab 05/18/17 2025  WBC 10.0  NEUTROABS 6.3  HGB 13.9  HCT 42.2  MCV 98.4  PLT 252   Basic Metabolic Panel:  Recent Labs Lab 05/18/17 2025  NA 138  K 3.8  CL 101  CO2 29  GLUCOSE 123*  BUN 18  CREATININE 0.74  CALCIUM 8.6*   GFR: Estimated Creatinine Clearance: 91 mL/min (by C-G formula based on SCr of 0.74 mg/dL). Liver Function Tests:  Recent Labs Lab 05/18/17 2025  AST 29  ALT 31  ALKPHOS 76  BILITOT 0.6  PROT 6.8  ALBUMIN 3.4*   No results for input(s): LIPASE, AMYLASE in the last 168 hours. No results for input(s): AMMONIA in the last 168 hours. Coagulation Profile: No results for input(s): INR, PROTIME in the last 168 hours. Cardiac Enzymes: No results for input(s): CKTOTAL, CKMB, CKMBINDEX, TROPONINI in the last 168 hours. BNP (last 3 results) No results for input(s): PROBNP in the last 8760 hours. HbA1C: No results for input(s): HGBA1C in the last 72 hours. CBG: No results for input(s): GLUCAP in the last 168 hours. Lipid Profile: No results for input(s): CHOL, HDL, LDLCALC, TRIG, CHOLHDL, LDLDIRECT in the last 72 hours. Thyroid Function Tests: No results for input(s): TSH, T4TOTAL, FREET4, T3FREE, THYROIDAB in the last 72 hours. Anemia Panel: No results for input(s): VITAMINB12, FOLATE, FERRITIN, TIBC, IRON, RETICCTPCT in the last 72 hours. Urine analysis: No  results found for: COLORURINE, APPEARANCEUR, LABSPEC, PHURINE, GLUCOSEU, HGBUR, BILIRUBINUR, KETONESUR, PROTEINUR, UROBILINOGEN, NITRITE, LEUKOCYTESUR Sepsis Labs: @LABRCNTIP (procalcitonin:4,lacticidven:4) )No results found for this or any previous visit (from the past 240 hour(s)).   Radiological Exams on Admission: Dg Tibia/fibula Left  Result Date: 05/18/2017 CLINICAL DATA:  Progressive pain and swelling. Laceration, sutured 1 week prior. EXAM: LEFT TIBIA AND FIBULA - 2 VIEW COMPARISON:  None. FINDINGS: No fracture. 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 No periosteal reaction or bony destructive change. Soft tissue edema is most prominent anterior laterally. Probable skin defect about the mid distal leg. No radiopaque foreign body or tracking soft tissue air. IMPRESSION:  Soft tissue edema is most prominent anterior laterally, possible skin defect about the mid distal lateral lower leg. No acute osseous abnormalities. Electronically Signed   By: Rubye Oaks M.D.   On: 05/18/2017 23:46   Ct Tibia Fibula Left W Contrast  Result Date: 05/19/2017 CLINICAL DATA:  Progressive pain and swelling of the left leg after laceration sutured 1 week ago. Erythema and site. EXAM: CT OF THE LOWER RIGHT EXTREMITY WITH CONTRAST TECHNIQUE: Multidetector CT imaging of the lower right extremity was performed according to the standard protocol following intravenous contrast administration. COMPARISON:  None. CONTRAST:  ISOVUE-300 IOPAMIDOL (ISOVUE-300) INJECTION 61% FINDINGS: Bones/Joint/Cartilage Tricompartmental osteoarthritis of the included knee. Intact tibia and fibula as well as ankle mortise. No frank bone destruction or fracture. Ligaments Suboptimally assessed by CT. Muscles and Tendons No intra muscular atrophy or hemorrhage. Soft tissues Skin laceration along the lateral aspect of mid leg with underlying subcutaneous induration. There is an 11.5 x  7.6 x 2.1 cm subacute appearing hematoma with overlying scattered subcutaneous gas along the anterolateral aspect of the left leg overlying the calf muscles. IMPRESSION: 1. Soft tissue hematoma overlying the anterolateral aspect of the calf muscles measuring 11.5 x 7.6 x 2.1 cm with soft tissue induration consistent with posttraumatic change and/or cellulitis. Small skin laceration is also noted anterolaterally. 2. No underlying fracture bone destruction. 3. Tricompartmental osteoarthritis of the knee. Electronically Signed   By: Tollie Eth M.D.   On: 05/19/2017 01:21     Assessment/Plan Principal Problem:   Cellulitis of left lower extremity Active Problems:   Cellulitis   Asthma   Morbid obesity (HCC)    1. Cellulitis of the left leg with recent trauma and hematoma status post suturing - for now I have placed patient on vancomycin and Zosyn. I have requested wound team consult since patient has blebs forming. Closely observe. At this time patient does not have any signs of compartment syndrome. 2. Hypertension on metoprolol. 3. Assessment presently not wheezing. 4. History of OSA noncompliant with CPAP.   DVT prophylaxis: Foot pump. Code Status: Full code.  Family Communication: Discussed with patient.  Disposition Plan: Home.  Consults called: Wound team.  Admission status: Inpatient.    Eduard Clos MD [facility] Pager [phone].  If 7PM-7AM, please contact night-coverage [email or web address] Password [email or web address]  05/19/2017, 5:08 AM

## 2017-05-19 NOTE — Consult Note (Signed)
WOC Nurse wound consult note Reason for Consult: wound s/p laceration repair.  Wound type: trauma  Measurement:  22 cm sutured laceration Wound bed: sutured wound with blood filled blistered noted. Marked some darkening of the tissue on the proximal edge of the suture line. This may evolve further would benefit from management in a wound care center of the patient's choice for close follow up Drainage (amount, consistency, odor) some minimal oozing of dark, old blood noted Periwound: induration, erythema, and edema. TTP Dressing procedure/placement/frequency: Cover suture line and blistered area with xeroform gauze, dry gauze and kerlix. Change daily.   Xeroform will serve as non adherent and antibacterial.  Would benefit in follow up at a wound care center of her choice for close follow up should the tissue damage evolve into eschar in the next few weeks.    Discussed POC with patient and bedside nurse.  Re consult if needed, will not follow at this time. Thanks  Magdalena Skilton M[employer] MSN, RN,CWOCN, CNS [phone])

## 2017-05-19 NOTE — Progress Notes (Signed)
Pharmacy Antibiotic Note  Marissa Lamb is a 71 y.o. female admitted on 05/18/2017 with cellulitis.  Pharmacy has been consulted for vancomycin and zosyn dosing. Patient with L lower extremity laceration and has been on a 7 day course of cephalexin prior to admission. Patient is morbidly obese and has a SCr of 1.04 back in February 2018 (estimated CrCl ~60 ml/min), new labs pending.  Plan: - Vancomycin 2500 mg IV loading dose x1 - Vancomycin 1750 mg IV q24h - Zosyn 3.375 Gm IV q8h (EI) - Daily Scr - Monitor renal function and VT as indicated - Monitor C&S, CBC and duration of therapy  Height: 5\' 2"  (157.5 cm) Weight: (!) 335 lb (152 kg) IBW/kg (Calculated) : 50.1  Temp (24hrs), Avg:98.7 F (37.1 C), Min:98.4 F (36.9 C), Max:99.2 F (37.3 C)   Recent Labs Lab 05/18/17 2025 05/18/17 2043  WBC 10.0  --   CREATININE 0.74  --   LATICACIDVEN  --  1.16    Estimated Creatinine Clearance: 91.2 mL/min (by C-G formula based on SCr of 0.74 mg/dL).    No Known Allergies  Antimicrobials this admission: Cephalexin 5/29 >> 6/3  Clindamycin >> 6/3 Zosyn 6/4 >> Vancomycin 6/3 >>   Dose adjustments this admission:  Microbiology results: 6/3 BCx:   Thank you for allowing pharmacy to be a part of this patient's care.    Lorenza EvangelistGreen, Mathieu Schloemer R 05/19/2017 5:30 AM

## 2017-05-19 NOTE — Progress Notes (Signed)
Patient seen and examined at bedside, patient admitted after midnight, please see earlier detailed admission note by Midge MiniumArshad Kakrakandy, MD. Briefly, patient presented with cellulitis s/p laceration repair. No changes to management.   Jacquelin Hawkingalph Zailee Vallely, MD [facility] 05/19/2017, 9:00 AM Pager: [phone]

## 2017-05-20 DIAGNOSIS — L539 Erythematous condition, unspecified: Secondary | ICD-10-CM | POA: Diagnosis not present

## 2017-05-20 DIAGNOSIS — M79605 Pain in left leg: Secondary | ICD-10-CM | POA: Diagnosis not present

## 2017-05-20 DIAGNOSIS — L03116 Cellulitis of left lower limb: Secondary | ICD-10-CM | POA: Diagnosis not present

## 2017-05-20 LAB — CREATININE, SERUM
Creatinine, Ser: 0.75 mg/dL (ref 0.44–1.00)
GFR calc Af Amer: >60 mL/min (ref >60)
GFR calc non Af Amer: >60 mL/min (ref >60)

## 2017-05-20 MED ORDER — DEXTROSE 5 % IV SOLN
2.0000 g | INTRAVENOUS | Status: DC
  Administered 2017-05-20 – 2017-05-21 (×2): 2 g via INTRAVENOUS
  Filled 2017-05-20 (×2): qty 2

## 2017-05-20 MED ORDER — LORATADINE 10 MG PO TABS
10.0000 mg | ORAL_TABLET | Freq: Every day | ORAL | Status: DC
  Administered 2017-05-20: 21:00:00 10 mg via ORAL
  Filled 2017-05-20: qty 1

## 2017-05-20 NOTE — Progress Notes (Addendum)
PROGRESS NOTE    Marissa Lamb  WUJ:[id number] DOB: 1946-03-03 DOA: 05/18/2017 PCP: Mattie Marlin, MD   Brief Narrative: Marissa Lamb is a 71 y.o. female with history of morbid obesity, osteoarthritis, hypertension and asthma. She presented with left leg pain and swelling and found to have cellulitis. Antibiotics started.   Assessment & Plan:   Principal Problem:   Cellulitis of left lower extremity Active Problems:   Cellulitis   Asthma   Morbid obesity (HCC)   Cellulitis Secondary to recent laceration repair of LLE. Improved minimally clinically however, still a large area of erythema surrounding laceration. No drainage. WBC increased from initial 10 to 11 yesterday. -discontinue Vancomycin/zosyn -start ceftriaxone; if continues to improve, will transition to Keflex in AM -wounc care recommending outpatient followup -blood cultures pending -repeat CBC to ensure leukocytosis is not worsening with current antibiotic regimen indicating possible need for broadening of antibiotics.  Essential hypertension -continue metoprolol  Mood disorder -continue Cymbalta -continue Elavil  DVT prophylaxis: Foot pumps (patient declining Lovenox) Code Status: Full code Family Communication: None at bedside Disposition Plan: Discharge likely in 24 hours   Consultants:   Wound care  Procedures:   None  Antimicrobials:  Vancomycin (6/3>>6/4)  Zosyn (6/3>>6/4)  Clindamycin (6/3)  Ceftriaxone (6/5>>    Subjective: Patient reports improvement in pain. No drainage noticed.  Objective: Vitals:   05/19/17 1329 05/19/17 2216 05/20/17 0041 05/20/17 0528  BP: (!) 116/59 (!) 158/80 (!) 160/81 131/63  Pulse: 68 75 97 68  Resp: 18 18 20 16   Temp: 98.1 F (36.7 C) 98.6 F (37 C) 98.6 F (37 C) 98.6 F (37 C)  TempSrc: Oral Oral Oral Oral  SpO2: 95% 97% 100% 96%  Weight:      Height:        Intake/Output Summary (Last 24 hours) at 05/20/17 0816 Last data filed at  05/20/17 0600  Gross per 24 hour  Intake             2280 ml  Output                0 ml  Net             2280 ml   Filed Weights   05/18/17 1514 05/19/17 0340  Weight: (!) 149.7 kg (330 lb) (!) 152 kg (335 lb)    Examination:  General exam: Appears calm and comfortable Respiratory system: Clear to auscultation. Respiratory effort normal. Cardiovascular system: S1 & S2 heard, RRR. No murmurs. Gastrointestinal system: Abdomen is nondistended, soft and nontender. Normal bowel sounds heard. Central nervous system: Alert and oriented. No focal neurological deficits. Extremities: No edema. No calf tenderness Skin: No cyanosis. Erythema is slightly below original markings; area is warm with no significant tenderness. Psychiatry: Judgement and insight appear normal. Mood & affect appropriate.     Data Reviewed: I have personally reviewed following labs and imaging studies  CBC:  Recent Labs Lab 05/18/17 2025 05/19/17 0525  WBC 10.0 11.1*  NEUTROABS 6.3  --   HGB 13.9 14.0  HCT 42.2 42.0  MCV 98.4 97.4  PLT 252 266   Basic Metabolic Panel:  Recent Labs Lab 05/18/17 2025 05/19/17 0525 05/20/17 0417  NA 138 140  --   K 3.8 4.3  --   CL 101 103  --   CO2 29 29  --   GLUCOSE 123* 116*  --   BUN 18 19  --   CREATININE 0.74 0.72 0.75  CALCIUM 8.6* 8.6*  --  GFR: Estimated Creatinine Clearance: 91.2 mL/min (by C-G formula based on SCr of 0.75 mg/dL). Liver Function Tests:  Recent Labs Lab 05/18/17 2025  AST 29  ALT 31  ALKPHOS 76  BILITOT 0.6  PROT 6.8  ALBUMIN 3.4*   No results for input(s): LIPASE, AMYLASE in the last 168 hours. No results for input(s): AMMONIA in the last 168 hours. Coagulation Profile: No results for input(s): INR, PROTIME in the last 168 hours. Cardiac Enzymes: No results for input(s): CKTOTAL, CKMB, CKMBINDEX, TROPONINI in the last 168 hours. BNP (last 3 results) No results for input(s): PROBNP in the last 8760 hours. HbA1C: No  results for input(s): HGBA1C in the last 72 hours. CBG: No results for input(s): GLUCAP in the last 168 hours. Lipid Profile: No results for input(s): CHOL, HDL, LDLCALC, TRIG, CHOLHDL, LDLDIRECT in the last 72 hours. Thyroid Function Tests: No results for input(s): TSH, T4TOTAL, FREET4, T3FREE, THYROIDAB in the last 72 hours. Anemia Panel: No results for input(s): VITAMINB12, FOLATE, FERRITIN, TIBC, IRON, RETICCTPCT in the last 72 hours. Sepsis Labs:  Recent Labs Lab 05/18/17 2043  LATICACIDVEN 1.16    No results found for this or any previous visit (from the past 240 hour(s)).       Radiology Studies: Dg Tibia/fibula Left  Result Date: 05/18/2017 CLINICAL DATA:  Progressive pain and swelling. Laceration, sutured 1 week prior. EXAM: LEFT TIBIA AND FIBULA - 2 VIEW COMPARISON:  None. FINDINGS: No fracture. 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 No periosteal reaction or bony destructive change. Soft tissue edema is most prominent anterior laterally. Probable skin defect about the mid distal leg. No radiopaque foreign body or tracking soft tissue air. IMPRESSION: Soft tissue edema is most prominent anterior laterally, possible skin defect about the mid distal lateral lower leg. No acute osseous abnormalities. Electronically Signed   By: Rubye OaksMelanie  Ehinger M.D.   On: 05/18/2017 23:46   Ct Tibia Fibula Left W Contrast  Result Date: 05/19/2017 CLINICAL DATA:  Progressive pain and swelling of the left leg after laceration sutured 1 week ago. Erythema and site. EXAM: CT OF THE LOWER RIGHT EXTREMITY WITH CONTRAST TECHNIQUE: Multidetector CT imaging of the lower right extremity was performed according to the standard protocol following intravenous contrast administration. COMPARISON:  None. CONTRAST:  100mL ISOVUE-300 IOPAMIDOL (ISOVUE-300) INJECTION 61% FINDINGS: Bones/Joint/Cartilage Tricompartmental osteoarthritis of the included knee.  Intact tibia and fibula as well as ankle mortise. No frank bone destruction or fracture. Ligaments Suboptimally assessed by CT. Muscles and Tendons No intra muscular atrophy or hemorrhage. Soft tissues Skin laceration along the lateral aspect of mid leg with underlying subcutaneous induration. There is an 11.5 x 7.6 x 2.1 cm subacute appearing hematoma with overlying scattered subcutaneous gas along the anterolateral aspect of the left leg overlying the calf muscles. IMPRESSION: 1. Soft tissue hematoma overlying the anterolateral aspect of the calf muscles measuring 11.5 x 7.6 x 2.1 cm with soft tissue induration consistent with posttraumatic change and/or cellulitis. Small skin laceration is also noted anterolaterally. 2. No underlying fracture bone destruction. 3. Tricompartmental osteoarthritis of the knee. Electronically Signed   By: Tollie Ethavid  Kwon M.D.   On: 05/19/2017 01:21        Scheduled Meds: . amitriptyline  10 mg Oral QHS  . DULoxetine  30 mg Oral QHS  . fluticasone  1 spray Each Nare Daily  . gabapentin  300 mg Oral BID  . metoprolol succinate  100 mg Oral QPC breakfast  .  pantoprazole  40 mg Oral Daily   Continuous Infusions: . cefTRIAXone (ROCEPHIN)  IV       LOS: 1 day     Jacquelin Hawking, MD [facility] 05/20/2017, 8:16 AM Pager: [phone]  If 7PM-7AM, please contact night-coverage [email or web address] Password [email or web address] 05/20/2017, 8:16 AM

## 2017-05-21 DIAGNOSIS — L03116 Cellulitis of left lower limb: Secondary | ICD-10-CM | POA: Diagnosis not present

## 2017-05-21 DIAGNOSIS — J45909 Unspecified asthma, uncomplicated: Secondary | ICD-10-CM | POA: Diagnosis not present

## 2017-05-21 LAB — CBC
HCT: 37.9 % (ref 36.0–46.0)
HEMOGLOBIN: 12.3 g/dL (ref 12.0–15.0)
MCH: 31.9 pg (ref 26.0–34.0)
MCHC: 32.5 g/dL (ref 30.0–36.0)
MCV: 98.2 fL (ref 78.0–100.0)
PLATELETS: 249 10*3/uL (ref 150–400)
RBC: 3.86 MIL/uL — AB (ref 3.87–5.11)
RDW: 13.3 % (ref 11.5–15.5)
WBC: 10 10*3/uL (ref 4.0–10.5)

## 2017-05-21 LAB — CREATININE, SERUM
CREATININE: 0.71 mg/dL (ref 0.44–1.00)
GFR calc Af Amer: >60 mL/min (ref >60)

## 2017-05-21 MED ORDER — DOXYCYCLINE HYCLATE 50 MG PO CAPS: 100.0000 mg | ORAL_CAPSULE | Freq: Two times a day (BID) | ORAL | 0 refills | Status: DC

## 2017-05-21 MED ORDER — FUROSEMIDE 20 MG PO TABS: 10.0000 mg | ORAL_TABLET | Freq: Every day | ORAL | 0 refills | Status: DC

## 2017-05-21 MED ORDER — FUROSEMIDE 20 MG PO TABS
10.0000 mg | ORAL_TABLET | Freq: Every day | ORAL | Status: DC
  Administered 2017-05-21: 16:00:00 10 mg via ORAL
  Filled 2017-05-21: qty 1

## 2017-05-21 MED ORDER — FUROSEMIDE 20 MG PO TABS: 10.0000 mg | ORAL_TABLET | Freq: Every day | ORAL | 0 refills | Status: AC

## 2017-05-21 MED ORDER — DOXYCYCLINE HYCLATE 50 MG PO CAPS: 100.0000 mg | ORAL_CAPSULE | Freq: Two times a day (BID) | ORAL | 0 refills | Status: AC

## 2017-05-21 NOTE — Progress Notes (Addendum)
PT Cancellation Note  Patient Details Name: Marissa Lamb MRN: [id number] DOB: [birth year]   Cancelled Treatment:    Reason Eval/Treat Not Completed: PT screened, no needs identified, will sign off. Spoke with pt who denied need for PT services. Will sign off at pt's request. Made RN aware.   Rebeca AlertJannie Deshanna Kama, MPT Pager: [phone]

## 2017-05-21 NOTE — Discharge Summary (Signed)
Physician Discharge Summary  Marissa FoilDoris Hast ZOX:[id number] DOB: [birth year] DOA: 05/18/2017  PCP: Mattie MarlinWillett, Ralph, MD  Admit date: 05/18/2017 Discharge date: 05/21/2017  Admitted From: Home.  Disposition:  Home.   Recommendations for Outpatient Follow-up:  1. Follow up with PCP in 1-2 weeks 2. Please obtain BMP/CBC in one week 3. Please follow up with wound care as recommended.     Discharge Condition:stable.  CODE STATUS: full code.  Diet recommendation: Heart Healthy   Brief/Interim Summary: Marissa Lamb is a 71 y.o. female with history of morbid obesity, osteoarthritis, hypertension and asthma presents to the ER because of worsening pain and swelling in the left leg. One week ago patient had a fall at her home following which had a laceration of the left leg and was sut[facility]. She reports worsening pain  And erythema in the left leg.   Discharge Diagnoses:  Principal Problem:   Cellulitis of left lower extremity Active Problems:   Cellulitis   Asthma   Morbid obesity (HCC)  Cellulitis of the left lower extremity: Improving, the suture line is healing, but she has persistent edema in the legs. So started her on low dose lasix and since pt wanted to go home, discharged her on oral doxycycline to complete the course.  Wound care consulted, and recommendations given.  Outpatient wound care follow up recommended.  Discussed the above with the patient and daughter.  Please follow up with PCp.    Asthma: No wheezing heard.   Morbid obesity: Life sytle modification.   Discharge Instructions  Discharge Instructions    Diet - low sodium heart healthy    Complete by:  As directed    Discharge instructions    Complete by:  As directed    Will need outpatient wound care follow up.  Please follow up with PCP in one week, before the antibiotics are completed.     Allergies as of 05/21/2017   No Known Allergies     Medication List    STOP  taking these medications   azithromycin 500 MG tablet Commonly known as:  ZITHROMAX   benzonatate 100 MG capsule Commonly known as:  TESSALON   cephALEXin 500 MG capsule Commonly known as:  KEFLEX   predniSONE 20 MG tablet Commonly known as:  DELTASONE     TAKE these medications   albuterol 108 (90 Base) MCG/ACT inhaler Commonly known as:  PROVENTIL HFA;VENTOLIN HFA Inhale 2 puffs into the lungs every 6 (six) hours as needed for wheezing or shortness of breath.   amitriptyline 10 MG tablet Commonly known as:  ELAVIL Take 10 mg by mouth at bedtime.   doxycycline 50 MG capsule Commonly known as:  VIBRAMYCIN Take 2 capsules (100 mg total) by mouth 2 (two) times daily.   DULoxetine 30 MG capsule Commonly known as:  CYMBALTA Take 30 mg by mouth at bedtime.   esomeprazole 40 MG capsule Commonly known as:  NEXIUM Take 40 mg by mouth daily as needed (reflux).   etodolac 400 MG tablet Commonly known as:  LODINE Take 400 mg by mouth 2 (two) times daily.   fluticasone 50 MCG/ACT nasal spray Commonly known as:  FLONASE Place 1 spray into both nostrils daily.   furosemide 20 MG tablet Commonly known as:  LASIX Take 0.5 tablets (10 mg total) by mouth daily.   gabapentin 300 MG capsule Commonly known as:  NEURONTIN Take 300 mg by mouth 2 (two) times daily.   ipratropium-albuterol 0.5-2.5 (3)  MG/3ML Soln Commonly known as:  DUONEB Take 3 mLs by nebulization every 6 (six) hours as needed (SOB, wheezing).   metoprolol succinate 100 MG 24 hr tablet Commonly known as:  TOPROL-XL Take 100 mg by mouth daily after breakfast. Take with or immediately following a meal.      Follow-up Information    Mattie Marlin, MD. Schedule an appointment as soon as possible for a visit in 1 week(s).   Specialty:  Internal Medicine         No Known Allergies  Consultations:  None.   Procedures/Studies: Dg Tibia/fibula Left  Result Date: 05/18/2017 CLINICAL DATA:  Progressive  pain and swelling. Laceration, sutured 1 week prior. EXAM: LEFT TIBIA AND FIBULA - 2 VIEW COMPARISON:  None. FINDINGS: No fracture. 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 No periosteal reaction or bony destructive change. Soft tissue edema is most prominent anterior laterally. Probable skin defect about the mid distal leg. No radiopaque foreign body or tracking soft tissue air. IMPRESSION: Soft tissue edema is most prominent anterior laterally, possible skin defect about the mid distal lateral lower leg. No acute osseous abnormalities. Electronically Signed   By: Rubye Oaks M.D.   On: 05/18/2017 23:46   Ct Tibia Fibula Left W Contrast  Result Date: 05/19/2017 CLINICAL DATA:  Progressive pain and swelling of the left leg after laceration sutured 1 week ago. Erythema and site. EXAM: CT OF THE LOWER RIGHT EXTREMITY WITH CONTRAST TECHNIQUE: Multidetector CT imaging of the lower right extremity was performed according to the standard protocol following intravenous contrast administration. COMPARISON:  None. CONTRAST:  ISOVUE-300 IOPAMIDOL (ISOVUE-300) INJECTION 61% FINDINGS: Bones/Joint/Cartilage Tricompartmental osteoarthritis of the included knee. Intact tibia and fibula as well as ankle mortise. No frank bone destruction or fracture. Ligaments Suboptimally assessed by CT. Muscles and Tendons No intra muscular atrophy or hemorrhage. Soft tissues Skin laceration along the lateral aspect of mid leg with underlying subcutaneous induration. There is an 11.5 x 7.6 x 2.1 cm subacute appearing hematoma with overlying scattered subcutaneous gas along the anterolateral aspect of the left leg overlying the calf muscles. IMPRESSION: 1. Soft tissue hematoma overlying the anterolateral aspect of the calf muscles measuring 11.5 x 7.6 x 2.1 cm with soft tissue induration consistent with posttraumatic change and/or cellulitis. Small skin laceration is also noted  anterolaterally. 2. No underlying fracture bone destruction. 3. Tricompartmental osteoarthritis of the knee. Electronically Signed   By: Tollie Eth M.D.   On: 05/19/2017 01:21      Subjective: Wants to go home.   Discharge Exam: Vitals:   05/21/17 0604 05/21/17 1001  BP: 134/67 (!) 157/73  Pulse: 86 74  Resp: 18 18  Temp: 98.6 F (37 C) 97.9 F (36.6 C)   Vitals:   05/20/17 1400 05/20/17 2104 05/21/17 0604 05/21/17 1001  BP: 125/62 120/75 134/67 (!) 157/73  Pulse: 87 90 86 74  Resp: 17 18 18 18   Temp: 98.2 F (36.8 C) 98.9 F (37.2 C) 98.6 F (37 C) 97.9 F (36.6 C)  TempSrc: Oral Oral Oral Oral  SpO2: 97% 99% 96% 96%  Weight:      Height:        General: Pt is alert, awake, not in acute distress Cardiovascular: RRR, S1/S2 +, no rubs, no gallops Respiratory: CTA bilaterally, no wheezing, no rhonchi Abdominal: Soft, NT, ND, bowel sounds + Extremities: pedal edema. Improvement of the erythema.     The results of significant diagnostics  from this hospitalization (including imaging, microbiology, ancillary and laboratory) are listed below for reference.     Microbiology: Recent Results (from the past 240 hour(s))  Blood culture (routine x 2)     Status: None (Preliminary result)   Collection Time: 05/18/17  8:25 PM  Result Value Ref Range Status   Specimen Description BLOOD LEFT ARM  Final   Special Requests   Final    BOTTLES DRAWN AEROBIC ONLY Blood Culture results may not be optimal due to an inadequate volume of blood received in culture bottles   Culture   Final    NO GROWTH 2 DAYS Performed at [facility], [address] [place], [place] [address]    Report Status PENDING  Incomplete  Blood culture (routine x 2)     Status: None (Preliminary result)   Collection Time: 05/18/17  8:40 PM  Result Value Ref Range Status   Specimen Description BLOOD LEFT ARM  Final   Special Requests   Final    BOTTLES DRAWN AEROBIC AND ANAEROBIC Blood Culture  results may not be optimal due to an excessive volume of blood received in culture bottles   Culture   Final    NO GROWTH 2 DAYS Performed at [facility], [address] [place], [place] [address]    Report Status PENDING  Incomplete     Labs: BNP (last 3 results)  Recent Labs  01/20/17 1707  BNP 108.8*   Basic Metabolic Panel:  Recent Labs Lab 05/18/17 2025 05/19/17 0525 05/20/17 0417 05/21/17 0449  NA 138 140  --   --   K 3.8 4.3  --   --   CL 101 103  --   --   CO2 29 29  --   --   GLUCOSE 123* 116*  --   --   BUN 18 19  --   --   CREATININE 0.74 0.72 0.75 0.71  CALCIUM 8.6* 8.6*  --   --    Liver Function Tests:  Recent Labs Lab 05/18/17 2025  AST 29  ALT 31  ALKPHOS 76  BILITOT 0.6  PROT 6.8  ALBUMIN 3.4*   No results for input(s): LIPASE, AMYLASE in the last 168 hours. No results for input(s): AMMONIA in the last 168 hours. CBC:  Recent Labs Lab 05/18/17 2025 05/19/17 0525 05/21/17 0449  WBC 10.0 11.1* 10.0  NEUTROABS 6.3  --   --   HGB 13.9 14.0 12.3  HCT 42.2 42.0 37.9  MCV 98.4 97.4 98.2  PLT 252 266 249   Cardiac Enzymes: No results for input(s): CKTOTAL, CKMB, CKMBINDEX, TROPONINI in the last 168 hours. BNP: Invalid input(s): POCBNP CBG: No results for input(s): GLUCAP in the last 168 hours. D-Dimer No results for input(s): DDIMER in the last 72 hours. Hgb A1c No results for input(s): HGBA1C in the last 72 hours. Lipid Profile No results for input(s): CHOL, HDL, LDLCALC, TRIG, CHOLHDL, LDLDIRECT in the last 72 hours. Thyroid function studies No results for input(s): TSH, T4TOTAL, T3FREE, THYROIDAB in the last 72 hours.  Invalid input(s): FREET3 Anemia work up No results for input(s): VITAMINB12, FOLATE, FERRITIN, TIBC, IRON, RETICCTPCT in the last 72 hours. Urinalysis No results found for: COLORURINE, APPEARANCEUR, LABSPEC, PHURINE, GLUCOSEU, HGBUR, BILIRUBINUR, KETONESUR, PROTEINUR, UROBILINOGEN, NITRITE,  LEUKOCYTESUR Sepsis Labs Invalid input(s): PROCALCITONIN,  WBC,  LACTICIDVEN Microbiology Recent Results (from the past 240 hour(s))  Blood culture (routine x 2)     Status: None (Preliminary  result)   Collection Time: 05/18/17  8:25 PM  Result Value Ref Range Status   Specimen Description BLOOD LEFT ARM  Final   Special Requests   Final    BOTTLES DRAWN AEROBIC ONLY Blood Culture results may not be optimal due to an inadequate volume of blood received in culture bottles   Culture   Final    NO GROWTH 2 DAYS Performed at [facility], [address], [place], [place] [address]    Report Status PENDING  Incomplete  Blood culture (routine x 2)     Status: None (Preliminary result)   Collection Time: 05/18/17  8:40 PM  Result Value Ref Range Status   Specimen Description BLOOD LEFT ARM  Final   Special Requests   Final    BOTTLES DRAWN AEROBIC AND ANAEROBIC Blood Culture results may not be optimal due to an excessive volume of blood received in culture bottles   Culture   Final    NO GROWTH 2 DAYS Performed at [facility], [address], [place], [place] [address]    Report Status PENDING  Incomplete     Time coordinating discharge: Over 30 minutes  SIGNED:   Kathlen Mody, MD  [facility] 05/21/2017, 11:16 PM Pager   If 7PM-7AM, please contact night-coverage [email or web address] Password [email or web address]

## 2017-05-21 NOTE — Progress Notes (Signed)
Spoke with patient at bedside. Patient lives at home with her daughter who is a Engineer, civil (consulting)nurse. Patient ambulates independently, has a cane, walker, and scooter. Also has a help chair. Currently wound care is daily, patient believes her daughter can manage this. She does not have a PICC, she states that her daughter has already stated that if she needs a IV abx that she can manage this. Per attending may d/c home today. No HH needs assess but will continue to be available as needed.

## 2017-05-24 LAB — CULTURE, BLOOD (ROUTINE X 2)
CULTURE: NO GROWTH
CULTURE: NO GROWTH

## 2017-05-28 DIAGNOSIS — S8012XA Contusion of left lower leg, initial encounter: Secondary | ICD-10-CM | POA: Diagnosis not present

## 2017-05-28 DIAGNOSIS — R7303 Prediabetes: Secondary | ICD-10-CM | POA: Diagnosis not present

## 2017-05-28 DIAGNOSIS — J45909 Unspecified asthma, uncomplicated: Secondary | ICD-10-CM | POA: Diagnosis not present

## 2017-05-28 DIAGNOSIS — I96 Gangrene, not elsewhere classified: Secondary | ICD-10-CM | POA: Diagnosis not present

## 2017-05-28 DIAGNOSIS — J9601 Acute respiratory failure with hypoxia: Secondary | ICD-10-CM | POA: Diagnosis not present

## 2017-05-28 DIAGNOSIS — L03116 Cellulitis of left lower limb: Secondary | ICD-10-CM | POA: Diagnosis not present

## 2017-05-28 DIAGNOSIS — I10 Essential (primary) hypertension: Secondary | ICD-10-CM | POA: Diagnosis not present

## 2017-05-28 DIAGNOSIS — J9811 Atelectasis: Secondary | ICD-10-CM | POA: Diagnosis not present

## 2017-05-29 DIAGNOSIS — J45909 Unspecified asthma, uncomplicated: Secondary | ICD-10-CM | POA: Diagnosis not present

## 2017-05-29 DIAGNOSIS — L03116 Cellulitis of left lower limb: Secondary | ICD-10-CM | POA: Diagnosis not present

## 2017-05-29 DIAGNOSIS — I10 Essential (primary) hypertension: Secondary | ICD-10-CM | POA: Diagnosis not present

## 2017-05-30 DIAGNOSIS — J9601 Acute respiratory failure with hypoxia: Secondary | ICD-10-CM

## 2017-05-30 DIAGNOSIS — S8012XA Contusion of left lower leg, initial encounter: Secondary | ICD-10-CM

## 2017-05-30 DIAGNOSIS — I10 Essential (primary) hypertension: Secondary | ICD-10-CM | POA: Diagnosis not present

## 2017-05-30 DIAGNOSIS — L03116 Cellulitis of left lower limb: Secondary | ICD-10-CM | POA: Diagnosis not present

## 2017-05-30 DIAGNOSIS — J45909 Unspecified asthma, uncomplicated: Secondary | ICD-10-CM | POA: Diagnosis not present

## 2017-05-30 DIAGNOSIS — R7303 Prediabetes: Secondary | ICD-10-CM

## 2017-05-30 DIAGNOSIS — I96 Gangrene, not elsewhere classified: Secondary | ICD-10-CM

## 2017-05-31 DIAGNOSIS — J45909 Unspecified asthma, uncomplicated: Secondary | ICD-10-CM | POA: Diagnosis not present

## 2017-05-31 DIAGNOSIS — L03116 Cellulitis of left lower limb: Secondary | ICD-10-CM | POA: Diagnosis not present

## 2017-05-31 DIAGNOSIS — I10 Essential (primary) hypertension: Secondary | ICD-10-CM | POA: Diagnosis not present

## 2017-05-31 DIAGNOSIS — J9811 Atelectasis: Secondary | ICD-10-CM

## 2017-11-13 IMAGING — CT CT TIBIA FIBULA *L* W/ CM
3 series · 10 of 35 positions shown, 11 images · IV contrast (agent unspecified)
Comparison: None.

CONTRAST:  100mL NSGQKM-U55 IOPAMIDOL (NSGQKM-U55) INJECTION 61%

CLINICAL DATA: Progressive pain and swelling of the left leg after
laceration sutured 1 week ago. Erythema and site.

EXAM:
CT OF THE LOWER RIGHT EXTREMITY WITH CONTRAST
TECHNIQUE: Multidetector CT imaging of the lower right extremity was performed
according to the standard protocol following intravenous contrast
administration.

[Series 2: axial bone · axial · 0.56mm/px · z∈[-1093,-873]mm · 2 of 238 slices shown, 3 images]
[im 73/238  soft-tissue]
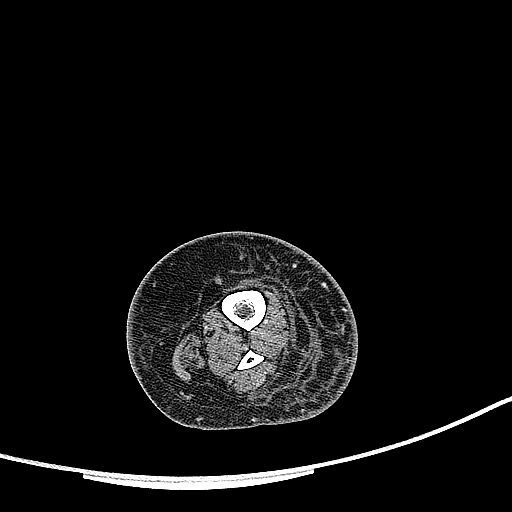
[im 73/238  bone]
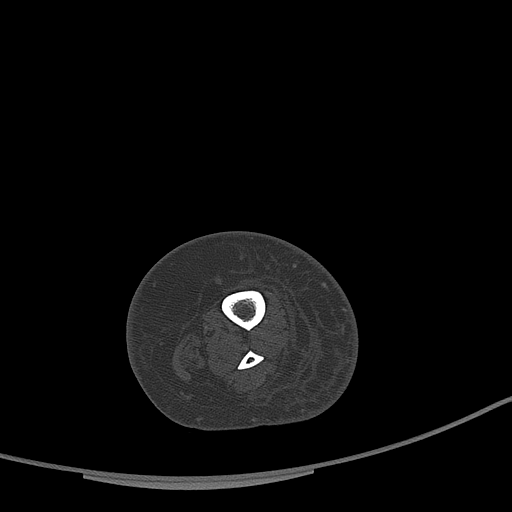
[im 183/238  bone]
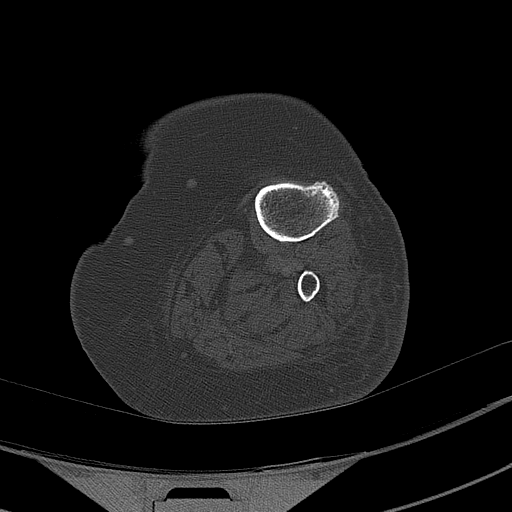

[Series 5: coronal st · coronal · 0.52mm/px · 3 of 110 slices shown]
[im 22/110  bone]
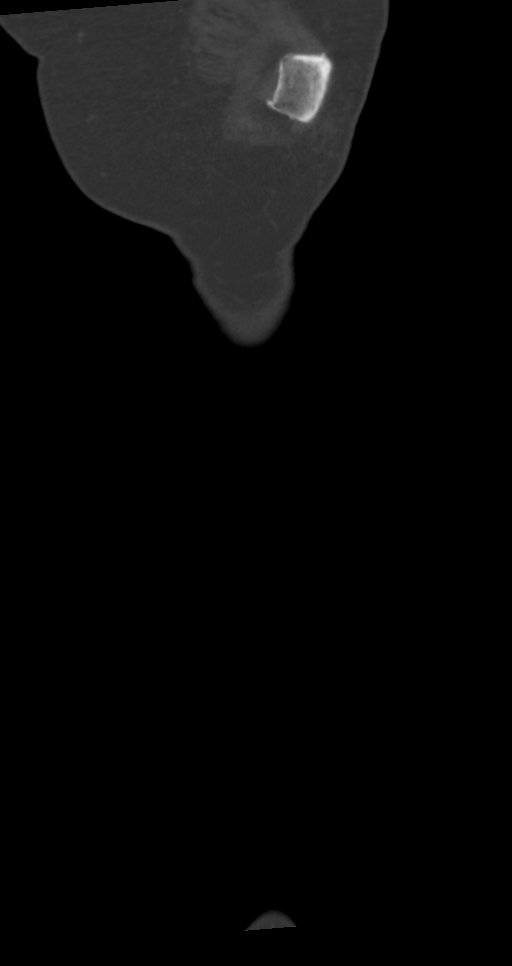
[im 44/110  bone]
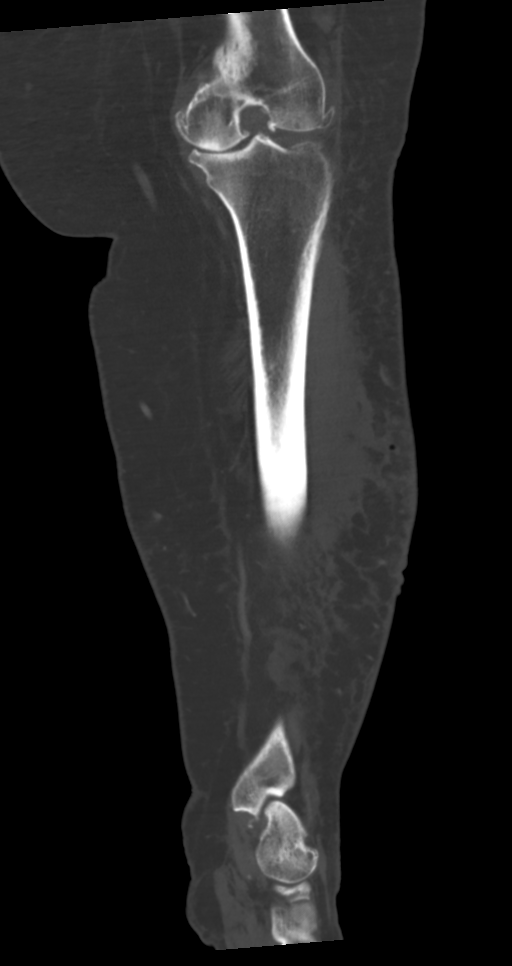
[im 66/110  bone]
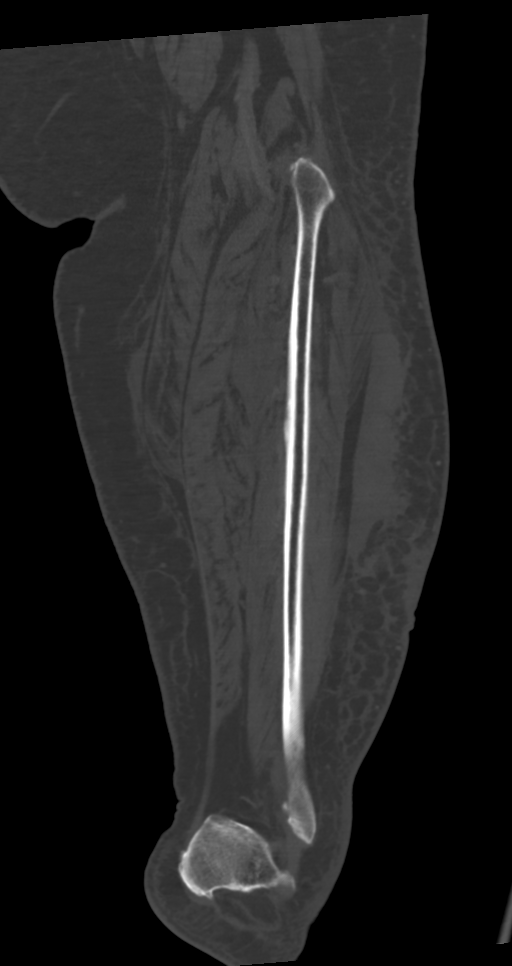

[Series 6: sagittal st · sagittal · 0.43mm/px · 5 of 81 slices shown]
[im 27/81  bone]
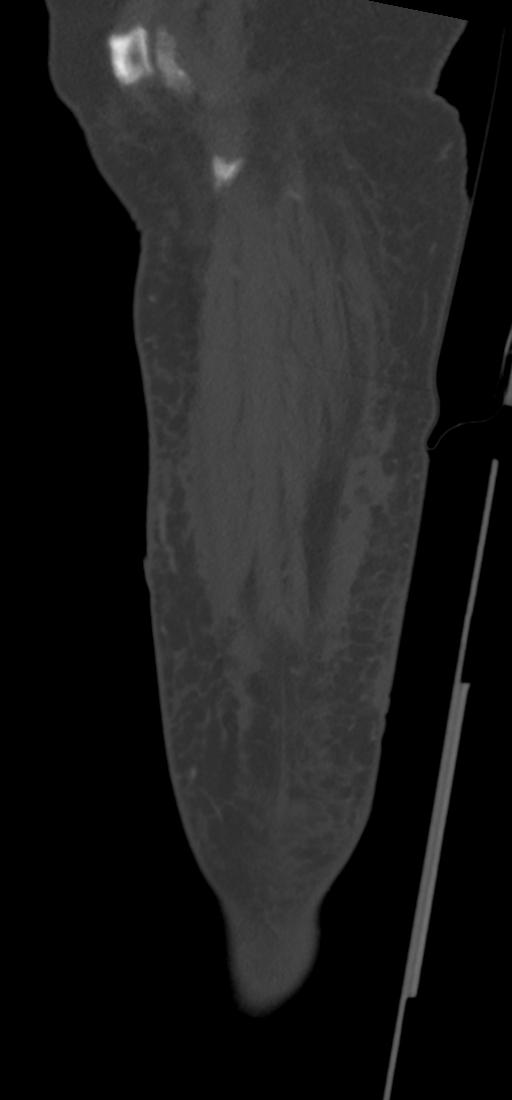
[im 34/81  bone]
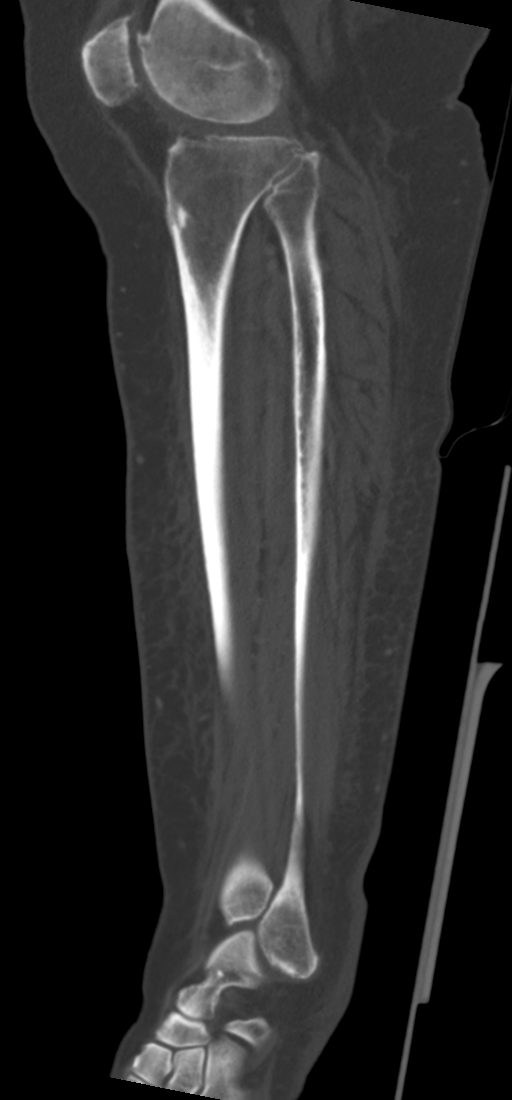
[im 41/81  bone]
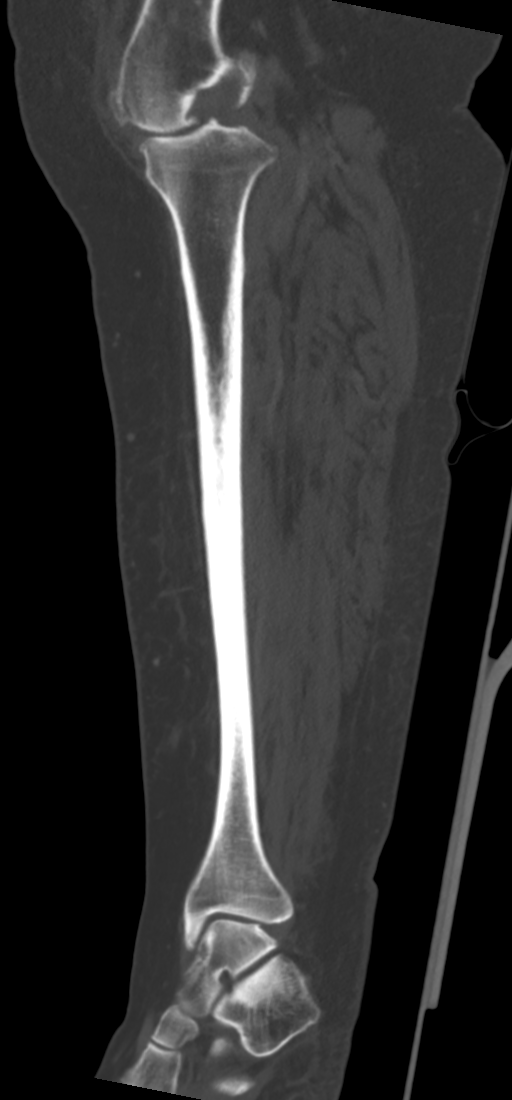
[im 47/81  bone]
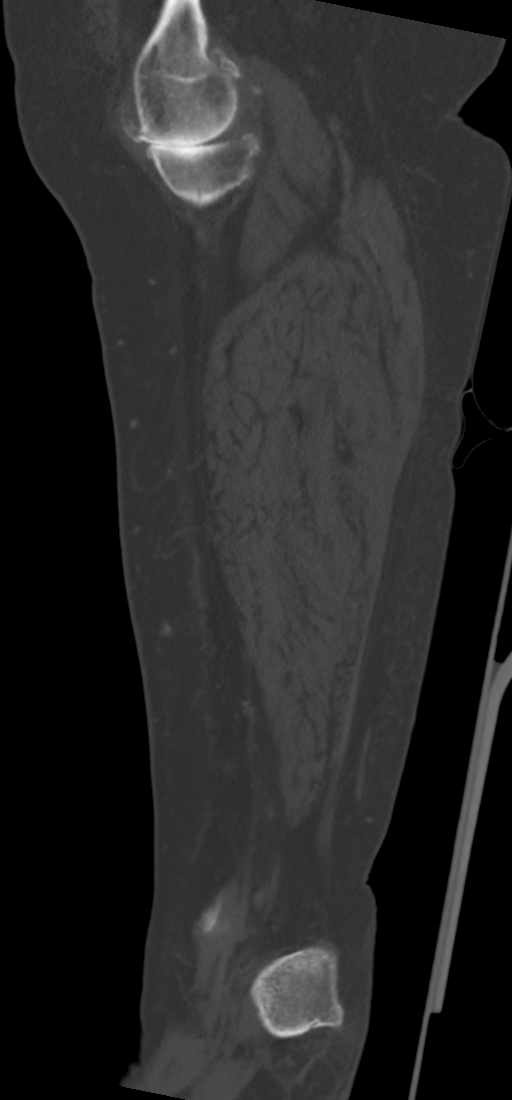
[im 54/81  bone]
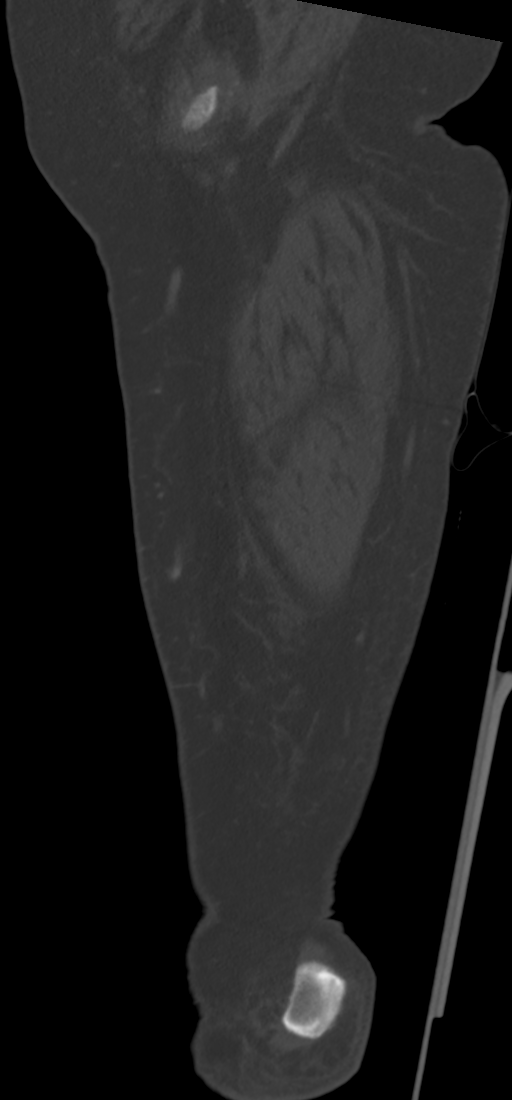

[10 of 35 positions shown; findings below may reference images not displayed]

FINDINGS: Bones/Joint/Cartilage

Tricompartmental osteoarthritis of the included knee. Intact tibia
and fibula as well as ankle mortise. No frank bone destruction or
fracture.

Ligaments

Suboptimally assessed by CT.

Muscles and Tendons

No intra muscular atrophy or hemorrhage.

Soft tissues

Skin laceration along the lateral aspect of mid leg with underlying
subcutaneous induration. There is an 11.5 x 7.6 x 2.1 cm subacute
appearing hematoma with overlying scattered subcutaneous gas along
the anterolateral aspect of the left leg overlying the calf muscles.
IMPRESSION: 1. Soft tissue hematoma overlying the anterolateral aspect of the
calf muscles measuring 11.5 x 7.6 x 2.1 cm with soft tissue
induration consistent with posttraumatic change and/or cellulitis.
Small skin laceration is also noted anterolaterally.
2. No underlying fracture bone destruction.
3. Tricompartmental osteoarthritis of the knee.

## 2019-01-05 DIAGNOSIS — N390 Urinary tract infection, site not specified: Secondary | ICD-10-CM

## 2019-01-05 DIAGNOSIS — I361 Nonrheumatic tricuspid (valve) insufficiency: Secondary | ICD-10-CM | POA: Diagnosis not present

## 2019-01-05 DIAGNOSIS — I471 Supraventricular tachycardia: Secondary | ICD-10-CM

## 2019-01-05 DIAGNOSIS — I351 Nonrheumatic aortic (valve) insufficiency: Secondary | ICD-10-CM

## 2019-01-05 DIAGNOSIS — I10 Essential (primary) hypertension: Secondary | ICD-10-CM

## 2019-01-05 DIAGNOSIS — E785 Hyperlipidemia, unspecified: Secondary | ICD-10-CM

## 2019-01-05 DIAGNOSIS — J45909 Unspecified asthma, uncomplicated: Secondary | ICD-10-CM

## 2019-01-06 DIAGNOSIS — N390 Urinary tract infection, site not specified: Secondary | ICD-10-CM | POA: Diagnosis not present

## 2019-01-06 DIAGNOSIS — I10 Essential (primary) hypertension: Secondary | ICD-10-CM | POA: Diagnosis not present

## 2019-01-06 DIAGNOSIS — J45909 Unspecified asthma, uncomplicated: Secondary | ICD-10-CM | POA: Diagnosis not present

## 2019-01-06 DIAGNOSIS — E785 Hyperlipidemia, unspecified: Secondary | ICD-10-CM | POA: Diagnosis not present

## 2022-10-16 DEATH — deceased
